# Patient Record
Sex: Female | Born: 1995 | Race: Black or African American | Hispanic: No | Marital: Single | Smoking: Former smoker
Health system: Southern US, Community
[De-identification: ages and names within clinical notes are randomized; demographics above are authoritative.]

## PROBLEM LIST (undated history)

## (undated) DIAGNOSIS — Z202 Contact with and (suspected) exposure to infections with a predominantly sexual mode of transmission: Secondary | ICD-10-CM

---
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
## 2017-04-15 ENCOUNTER — Ambulatory Visit (HOSPITAL_COMMUNITY)
Admission: EM | Admit: 2017-04-15 | Discharge: 2017-04-15 | Disposition: A | Discharge status: Home / Self Care | Payer: Medicaid Other | Attending: Family Medicine | Admitting: Family Medicine

## 2017-04-15 ENCOUNTER — Encounter (HOSPITAL_COMMUNITY): Payer: Self-pay | Admitting: Emergency Medicine

## 2017-04-15 DIAGNOSIS — T700XXA Otitic barotrauma, initial encounter: Secondary | ICD-10-CM | POA: Diagnosis not present

## 2017-04-15 DIAGNOSIS — H9193 Unspecified hearing loss, bilateral: Secondary | ICD-10-CM

## 2017-04-15 DIAGNOSIS — J309 Allergic rhinitis, unspecified: Secondary | ICD-10-CM

## 2017-04-15 DIAGNOSIS — H6983 Other specified disorders of Eustachian tube, bilateral: Secondary | ICD-10-CM

## 2017-04-15 MED ORDER — PREDNISONE 50 MG PO TABS: ORAL_TABLET | ORAL | 0 refills | Status: DC

## 2017-04-15 NOTE — ED Triage Notes (Signed)
Bilateral ear pain and fullness and altered hearing .  Patient has sinus congestion and stuffiness.  Patient has had episodes of gasping in her sleep.

## 2017-04-15 NOTE — Discharge Instructions (Signed)
Take Sudafed PE 10 mg every 4 hours. If this is not helping any  may want to try the pseudoephedrine that you have to sign for when purchased. It does have some side effects such as jitteriness, increased heart rate and blood pressure. Take the prednisone for inflammation. Take just 1 antihistamine such as Zyrtec or Allegra. Recommend not taking Benadryl as this is too drying. Use lots of saline nasal spray frequently 12 loosen the mucus and blow it out. You cannot overdose on this medicine. Also recommend using either Flonase or Rhinocort regularly. A Netty Pot can also useful and cleaning the sinuses.

## 2017-04-15 NOTE — ED Provider Notes (Signed)
CSN: [id number]     Arrival date & time 04/15/17  1716 History   First MD Initiated Contact with Patient 04/15/17 1852     Chief Complaint  Patient presents with  . Facial Pain   (Consider location/radiation/quality/duration/timing/severity/associated sxs/prior Treatment) 21 year old female complaining of diminished hearing in both ears over the past 3 weeks. She states since allergy season started several weeks ago she has had the worse case she is ever had. She has persistent upper respiratory congestion, nasal congestion as well. Denies otalgia. Does have some tinnitus. She has taken several OTC medications without significant relief. No fever or chills.      History reviewed. No pertinent past medical history. History reviewed. No pertinent surgical history. Family History  Problem Relation Age of Onset  . Diabetes Maternal Uncle   . Cancer Maternal Grandmother   . Hypertension Maternal Grandmother   . Heart disease Neg Hx    Social History  Substance Use Topics  . Smoking status: Never Smoker  . Smokeless tobacco: Not on file  . Alcohol use No   OB History    No data available     Review of Systems  Constitutional: Negative.  Negative for activity change, appetite change, chills, fatigue and fever.  HENT: Positive for congestion, postnasal drip and sinus pressure. Negative for ear discharge, ear pain, facial swelling, rhinorrhea, sore throat and trouble swallowing.   Eyes: Negative.   Respiratory: Negative for shortness of breath.        Sometimes at night she finds herself awaking gasping for air since she has had some much congestion.  Cardiovascular: Negative.   Gastrointestinal: Negative.   Musculoskeletal: Negative for neck pain and neck stiffness.  Skin: Negative for pallor and rash.  Neurological: Negative.   All other systems reviewed and are negative.   Allergies  Patient has no known allergies.  Home Medications   Prior to Admission medications    Medication Sig Start Date End Date Taking? Authorizing Provider  predniSONE (DELTASONE) 50 MG tablet 1 tab po daily for 6 days. Take with food. 04/15/17   Hayden RasmussenMabe, Keylani Perlstein, NP   Meds Ordered and Administered this Visit  Medications - No data to display  BP 99/61 (BP Location: Right Arm)   Pulse 77   Temp 97.9 F (36.6 C) (Oral)   Resp 16   LMP 04/01/2017   SpO2 100%  No data found.   Physical Exam  Constitutional: She is oriented to person, place, and time. She appears well-developed and well-nourished.  HENT:  Head: Normocephalic and atraumatic.  Mouth/Throat: No oropharyngeal exudate.  Bilateral TMs are severely retracted. Right TM with small dependent effusion. No erythema. No bleeding. EAC is widely patent.  Oropharynx with scant PND. No swelling. Minimal erythema.  Eyes: EOM are normal.  Neck: Normal range of motion. Neck supple.  Cardiovascular: Normal rate.   Pulmonary/Chest: Effort normal and breath sounds normal. No respiratory distress.  Musculoskeletal: Normal range of motion. She exhibits no edema.  Lymphadenopathy:    She has no cervical adenopathy.  Neurological: She is alert and oriented to person, place, and time.  Skin: Skin is warm and dry.  Nursing note and vitals reviewed.   Urgent Care Course     Procedures (including critical care time)  Labs Review Labs Reviewed - No data to display  Imaging Review No results found.   Visual Acuity Review  Right Eye Distance:   Left Eye Distance:   Bilateral Distance:    Right Eye Near:  Left Eye Near:    Bilateral Near:         MDM   1. Barotitis media, initial encounter   2. Decreased hearing of both ears   3. Allergic sinusitis   4. Eustachian tube dysfunction, bilateral    Take Sudafed PE 10 mg every 4 hours. If this is not helping any  may want to try the pseudoephedrine that you have to sign for when purchased. It does have some side effects such as jitteriness, increased heart rate and  blood pressure. Take the prednisone for inflammation. Take just 1 antihistamine such as Zyrtec or Allegra. Recommend not taking Benadryl as this is too drying. Use lots of saline nasal spray frequently 12 loosen the mucus and blow it out. You cannot overdose on this medicine. Also recommend using either Flonase or Rhinocort regularly. A Netty Pot can also useful and cleaning the sinuses. Meds ordered this encounter  Medications  . predniSONE (DELTASONE) 50 MG tablet    Sig: 1 tab po daily for 6 days. Take with food.    Dispense:  7 tablet    Refill:  0    Order Specific Question:   Supervising Provider    Answer:   Lonia Blood       Hayden Rasmussen, NP 04/15/17 1911

## 2017-04-23 ENCOUNTER — Ambulatory Visit: Payer: Medicaid Other | Admitting: Family Medicine

## 2017-06-12 ENCOUNTER — Ambulatory Visit (INDEPENDENT_AMBULATORY_CARE_PROVIDER_SITE_OTHER): Payer: Medicaid Other | Admitting: Family Medicine

## 2017-06-12 ENCOUNTER — Other Ambulatory Visit (HOSPITAL_COMMUNITY)
Admission: RE | Admit: 2017-06-12 | Discharge: 2017-06-12 | Disposition: A | Discharge status: Home / Self Care | Payer: Medicaid Other | Source: Ambulatory Visit | Attending: Family Medicine | Admitting: Family Medicine

## 2017-06-12 ENCOUNTER — Encounter: Payer: Self-pay | Admitting: Family Medicine

## 2017-06-12 VITALS — BP 102/70 | HR 68 | Temp 98.3°F | Ht 68.0 in | Wt 236.0 lb

## 2017-06-12 DIAGNOSIS — N939 Abnormal uterine and vaginal bleeding, unspecified: Secondary | ICD-10-CM | POA: Diagnosis not present

## 2017-06-12 DIAGNOSIS — Z202 Contact with and (suspected) exposure to infections with a predominantly sexual mode of transmission: Secondary | ICD-10-CM | POA: Diagnosis present

## 2017-06-12 LAB — POCT WET PREP (WET MOUNT)
Clue Cells Wet Prep Whiff POC: NEGATIVE
TRICHOMONAS WET PREP HPF POC: ABSENT

## 2017-06-12 NOTE — Progress Notes (Signed)
    Subjective:  Ann [name] is a 21 y.o. female presenting with monthly periods, hot flashes and cramping  HPI:  Patient is a 21 year old female with nexplanon who complains of monthly and sometimes bimonthly periods as well as hot flashes and cramping. She the nexplanon placed around 10 months ago and symptoms have persisted since then. Patient understands that it is not unusual to have bleeding for the first 6-12 months and the accompanying symptoms are common side effects with the exception of hot flashes. At this point she wishes to keep the nexplanon in place and to treat her symptoms. Reports taking as needed Tylenol for symptoms without much relief.    PMH: insignificant Tobacco use: brief history of smoking Medication: reviewed and updated ROS: see HPI   Objective:  Physical Exam: BP 102/70   Pulse 68   Temp 98.3 F (36.8 C) (Oral)   Ht 5\' 8"  (1.727 m)   Wt 236 lb (107 kg)   LMP 06/05/2017   SpO2 98%   BMI 35.88 kg/m   Gen: 21yo F in NAD, resting comfortably CV: RRR with no murmurs appreciated Pulm: NWOB, CTAB with no crackles, wheezes, or rhonchi GI: Normal bowel sounds present. Soft, Nontender, Nondistended. MSK: no edema, cyanosis, or clubbing noted GU: normal appearing external anatomy, cervix non-friable and no ectropion and no cervical motion tenderness or adnexa Skin: warm, dry Neuro: grossly normal, moves all extremities Psych: Normal affect and thought content  Results for orders placed or performed in visit on 06/12/17 (from the past 72 hour(s))  POCT Wet Prep Mellody Drown(Wet [place])     Status: Abnormal   Collection Time: 06/12/17  2:36 PM  Result Value Ref Range   Source Wet Prep POC VAG    WBC, Wet Prep HPF POC 5-10    Bacteria Wet Prep HPF POC Moderate (A) Few   Clue Cells Wet Prep HPF POC Few (A) None   Clue Cells Wet Prep Whiff POC Negative Whiff    Yeast Wet Prep HPF POC None    Trichomonas Wet Prep HPF POC Absent Absent     Assessment/Plan:    Abnormal uterine bleeding (AUB) Several months of heavy periods, hot flashes and cramping associated with menses.  Prior to having nexplanon placed she did not have these symptoms. Wishes to stick with nexplanon at this time. Placed 72mo ago. Options at this point would be trial of NSAIDs or combined oral contraceptive pills.   - 600mg  ibuprofen scheduled for 5-7 during next menses - f/u in 3 months - if symptoms persist will consider combined OCP or removal of nexplanon  STD screen Patient requested STD screening as she has never been tested and has previously had unprotected sex with two men over last year. No reason to believe she has STD. Neither man is an IVDU to her knowledge. -check GC/chlamydia, HIV, RPR - wet prep showing few clue cells, but patient is asymptomatic

## 2017-06-12 NOTE — Patient Instructions (Signed)
Ann [name], you were seen today for abnormal bleeding, cramps, hot flashes and abdominal discomfort with your periods.  This can be seen for the first 6-12 months after placing a nexplanon.  I would recommend that you take 600mg  of ibuprofen as needed for the next few months and I would like you to follow up with me at that point.   If your symptoms continue, we could consider trying a birth control pill for a couple of weeks, which sometimes helps with these symptoms.  Otherwise we could discuss removing the nexplanon if you are still uncomfortable.   We also checked today for STDs.  I will follow up with you regarding this results.   Very nice seeing you today.  Stoy Fenn L. Myrtie SomanWarden, MD [facility] Resident PGY-2 06/12/2017 3:02 PM

## 2017-06-12 NOTE — Assessment & Plan Note (Signed)
Several months of heavy periods, hot flashes and cramping associated with menses.  Prior to having nexplanon placed she did not have these symptoms. Wishes to stick with nexplanon at this time. Placed 48mo ago. Options at this point would be trial of NSAIDs or combined oral contraceptive pills.   - 600mg  ibuprofen scheduled for 5-7 during next menses - f/u in 3 months - if symptoms persist will consider combined OCP or removal of nexplanon

## 2017-06-13 LAB — RPR: RPR: NONREACTIVE

## 2017-06-13 LAB — CERVICOVAGINAL ANCILLARY ONLY
CHLAMYDIA, DNA PROBE: POSITIVE — AB
Neisseria Gonorrhea: NEGATIVE

## 2017-06-13 LAB — HIV ANTIBODY (ROUTINE TESTING W REFLEX): HIV SCREEN 4TH GENERATION: NONREACTIVE

## 2017-06-14 ENCOUNTER — Other Ambulatory Visit: Payer: Self-pay | Admitting: Family Medicine

## 2017-06-14 MED ORDER — AZITHROMYCIN 250 MG PO TABS: 1000.0000 mg | ORAL_TABLET | Freq: Once | ORAL | 0 refills | Status: AC

## 2017-06-14 NOTE — Progress Notes (Signed)
Called patient to let her know about pos chlamydia. Filled prescription for azithromycin 1g once and will write script for boyfriend and leave up front. Plan to call patient to let her know when script is available.   Arlee Bossard L. Myrtie SomanWarden, MD [facility] Resident PGY-2 06/14/2017 8:55 AM

## 2017-06-21 ENCOUNTER — Telehealth: Payer: Self-pay | Admitting: *Deleted

## 2017-06-21 NOTE — Telephone Encounter (Signed)
Patient states that she picked up her zithromax from the pharmacy and took all 4 at one time as prescribed but vomited about 20-30 mins after taking them.  She did take them on an empty stomach because that is what it said on the bottle.  She would like to know if she needs to get another script and take them with food this time or what her next step needs to be.  Did state that her boyfriend took his pills with no problem.  Will forward to MD. Burnard HawthorneJazmin [name],CMA

## 2017-06-24 ENCOUNTER — Other Ambulatory Visit: Payer: Self-pay | Admitting: Family Medicine

## 2017-06-24 MED ORDER — AZITHROMYCIN 250 MG PO TABS: 1000.0000 mg | ORAL_TABLET | Freq: Once | ORAL | 0 refills | Status: AC

## 2017-06-24 NOTE — Telephone Encounter (Signed)
Since she threw it up so soon after, I would recommend she take the medication again.  I have sent in another prescription.  Please let the patient know.  Thanks, Rande Bruntaniel L. Myrtie SomanWarden, MD [facility] Resident PGY-2 06/24/2017 7:46 AM

## 2017-06-24 NOTE — Telephone Encounter (Signed)
Pt contacted and informed of new rx sent to pharmacy. Pt stated she did not eat at all the day she took the medication. I informed pt to go ahead eat something and take medication later today. Pt voiced understanding.

## 2017-09-16 ENCOUNTER — Ambulatory Visit: Payer: Self-pay | Admitting: Family Medicine

## 2017-09-17 ENCOUNTER — Encounter: Payer: Self-pay | Admitting: Family Medicine

## 2017-09-17 ENCOUNTER — Other Ambulatory Visit: Payer: Self-pay | Admitting: Family Medicine

## 2017-09-17 ENCOUNTER — Other Ambulatory Visit (HOSPITAL_COMMUNITY)
Admission: RE | Admit: 2017-09-17 | Discharge: 2017-09-17 | Disposition: A | Discharge status: Home / Self Care | Payer: Medicaid Other | Source: Ambulatory Visit | Attending: Family Medicine | Admitting: Family Medicine

## 2017-09-17 ENCOUNTER — Ambulatory Visit (INDEPENDENT_AMBULATORY_CARE_PROVIDER_SITE_OTHER): Payer: Self-pay | Admitting: Family Medicine

## 2017-09-17 ENCOUNTER — Telehealth: Payer: Self-pay | Admitting: *Deleted

## 2017-09-17 VITALS — BP 104/68 | HR 68 | Temp 98.0°F | Ht 68.0 in | Wt 253.2 lb

## 2017-09-17 DIAGNOSIS — Z3046 Encounter for surveillance of implantable subdermal contraceptive: Secondary | ICD-10-CM

## 2017-09-17 DIAGNOSIS — Z113 Encounter for screening for infections with a predominantly sexual mode of transmission: Secondary | ICD-10-CM

## 2017-09-17 DIAGNOSIS — Z309 Encounter for contraceptive management, unspecified: Secondary | ICD-10-CM

## 2017-09-17 DIAGNOSIS — N6320 Unspecified lump in the left breast, unspecified quadrant: Secondary | ICD-10-CM

## 2017-09-17 DIAGNOSIS — N63 Unspecified lump in unspecified breast: Secondary | ICD-10-CM

## 2017-09-17 NOTE — Assessment & Plan Note (Signed)
Presented for nexplanon removal. Capsule difficult to palpate today. Had preceptor Dr. Gwendolyn GrantWalden attempt to palpate. Ordered ultrasound to locate device. If this is not deep or in muscle, will have her back for procedural clinic to ultrasound and remove. Otherwise may need referral.

## 2017-09-17 NOTE — Telephone Encounter (Signed)
Received call on nurse line that u/s needed to be scheduled with IR. Appt made for Th 11/1 with arrival at 1030 for 1045 appt. [address] on first floor. No special instructions. Attempted to relay appt info to patient. Left message on VM requesting return call. Kinnie FeilL. Sophiah Rolin, RN, BSN

## 2017-09-17 NOTE — Progress Notes (Signed)
    Subjective:  Ann [name] is a 21 y.o. female who [facility]on removal to have chlamydia recheck and for breast lump.   HPI:  Nexplanon: Has had this in for one year now. Previously was having vaginal bleeding during the first 6-12 months and some intermittent hot flashes and cramps. Will sometimes have some burning sensation in her abdomen, which she attributes to the nexplanon.  Non-smoker, no DVT history, no cancer history. Wishes to go on the pill.   Left breast mass: no fevers, chills or drainage. Similar spot 4 years ago and did have drainage from the nipple. Was hard and big yesterday and the size has decreased this morning. Not currently having her period, but does have some of the cramping symptoms.   History of chlamydia: Tested positive and treated in July. Denies any discharge, fevers, chills, dysuria, abdominal pain. Wants to be retested. No risky sexual behavior.    PMH: chlamydia infection, obesity Tobacco use: brief history of smoking Medication: reviewed and updated ROS: see HPI   Objective:  Physical Exam: BP 104/68   Pulse 68   Temp 98 F (36.7 C) (Oral)   Ht 5\' 8"  (1.727 m)   Wt 253 lb 3.2 oz (114.9 kg)   SpO2 99%   BMI 38.50 kg/m   Gen: 21yo F in NAD, resting comfortably CV: RRR with no murmurs appreciated Chest: Possible left breast mass near the nipple. However did have glandular texture. No surface skin changes.  Pulm: NWOB, CTAB with no crackles, wheezes, or rhonchi GI: Normal bowel sounds present. Soft, Nontender, Nondistended. MSK: no edema, cyanosis, or clubbing noted. Multiple nodular hyperpigmented areas that are minimally TTP in the suprapubic and inguinal regions bilaterally.  Skin: warm, dry, nexplanon capsule difficult to palpate GU: normal appearing female genitalia, speculum exam revealed mod amount of white discharge, red appearing cervix with white/clear dischage coming from a very mildly opened external  os. No cervical motion tenderness and no blood in vaginal vault or coming from cervix.  Neuro: grossly normal, moves all extremities Psych: Normal affect and thought content  No results found for this or any previous visit (from the past 72 hour(s)).   Assessment/Plan:  Encounter for contraceptive management Presented for nexplanon removal. Capsule difficult to palpate today. Had preceptor Dr. Gwendolyn GrantWalden attempt to palpate. Ordered ultrasound to locate device. If this is not deep or in muscle, will have her back for procedural clinic to ultrasound and remove. Otherwise may need referral.    Left breast mass Possible left breast mass. Felt more glandular. Patient is very concerned due to having a history of prior breast mass. I was unable to palpate a discrete nodule. Patient does have very large breasts. Ordered ultrasound of the left breast to rule out mass.   Hyperpigmented nodular lesions  In suprapubic and inguinal regions that are minimally TTP. No erythema or drainage. These have been there for a while now. Could be hydradenitis. Asked patient to schedule follow up appointment as we were discussing many things at this visit. There is no acute reason for treatment at this time.   Chlamydia, history of Endocervical collection and testing for gonorrhea and chlamydia today. No fevers, chills, abdominal pain, dysuria or CMT. Follow up gc/chlaymdia.   Pilar Corrales L. Myrtie SomanWarden, MD [facility] Resident PGY-2 09/17/2017 12:34 PM

## 2017-09-17 NOTE — Patient Instructions (Signed)
Naaman Plummermbriya, you were seen today for a check to make sure your chlamydia cleared up. I will follow up with you regarding those results.   I did not appreciate any discrete breast mass on your left side, but will get an ultrasound to make sure.   We were also unable to really get a great feel for your nexplanon in your arm.  I have ordered an ultrasound to locate it. When that comes back we will either have you back in the office to remove it or will send you to a specialist to get it removed if it is deep into the muscle.   If you have any concerns please call the office.    Very nice to see you today!  Take care, Darragh Nay L. Myrtie SomanWarden, MD [facility] Resident PGY-2 09/17/2017 11:05 AM

## 2017-09-18 ENCOUNTER — Encounter: Payer: Self-pay | Admitting: Family Medicine

## 2017-09-18 LAB — CERVICOVAGINAL ANCILLARY ONLY
Chlamydia: NEGATIVE
NEISSERIA GONORRHEA: NEGATIVE

## 2017-09-18 NOTE — Telephone Encounter (Signed)
Spoke with pt. She was aware of appt change. Scheduled appt for pt to meet with Dr. Myrtie SomanWarden. Sunday SpillersSharon T Saunders, CMA

## 2017-09-20 ENCOUNTER — Other Ambulatory Visit: Payer: Medicaid Other

## 2017-09-20 ENCOUNTER — Other Ambulatory Visit: Payer: Self-pay | Admitting: Family Medicine

## 2017-09-20 ENCOUNTER — Ambulatory Visit
Admission: RE | Admit: 2017-09-20 | Discharge: 2017-09-20 | Disposition: A | Discharge status: Home / Self Care | Payer: Medicaid Other | Source: Ambulatory Visit | Attending: Family Medicine | Admitting: Family Medicine

## 2017-09-20 DIAGNOSIS — N6320 Unspecified lump in the left breast, unspecified quadrant: Secondary | ICD-10-CM

## 2017-09-23 ENCOUNTER — Other Ambulatory Visit: Payer: Medicaid Other

## 2017-09-24 ENCOUNTER — Ambulatory Visit: Payer: Medicaid Other | Admitting: Family Medicine

## 2017-09-26 ENCOUNTER — Other Ambulatory Visit: Payer: Medicaid Other

## 2017-09-26 ENCOUNTER — Ambulatory Visit
Admission: RE | Admit: 2017-09-26 | Discharge: 2017-09-26 | Disposition: A | Discharge status: Home / Self Care | Payer: Medicaid Other | Source: Ambulatory Visit | Attending: Family Medicine | Admitting: Family Medicine

## 2017-09-26 DIAGNOSIS — Z3046 Encounter for surveillance of implantable subdermal contraceptive: Secondary | ICD-10-CM

## 2017-09-30 ENCOUNTER — Ambulatory Visit: Payer: No Typology Code available for payment source | Admitting: Family Medicine

## 2017-10-10 ENCOUNTER — Ambulatory Visit (INDEPENDENT_AMBULATORY_CARE_PROVIDER_SITE_OTHER): Payer: Medicaid Other | Admitting: Family Medicine

## 2017-10-10 ENCOUNTER — Encounter: Payer: Self-pay | Admitting: Family Medicine

## 2017-10-10 VITALS — BP 100/64 | HR 62 | Temp 98.7°F | Ht 68.0 in | Wt 252.2 lb

## 2017-10-10 DIAGNOSIS — Z30011 Encounter for initial prescription of contraceptive pills: Secondary | ICD-10-CM | POA: Diagnosis present

## 2017-10-10 DIAGNOSIS — Z309 Encounter for contraceptive management, unspecified: Secondary | ICD-10-CM

## 2017-10-10 DIAGNOSIS — Z3046 Encounter for surveillance of implantable subdermal contraceptive: Secondary | ICD-10-CM

## 2017-10-10 MED ORDER — NORGESTIMATE-ETH ESTRADIOL 0.25-35 MG-MCG PO TABS: 1.0000 | ORAL_TABLET | Freq: Every day | ORAL | 11 refills | Status: DC

## 2017-10-10 NOTE — Patient Instructions (Signed)
Naaman PlummerAmbriya, I have prescribed you spintec birth control pills.  You can start taking this on Sunday.  I would recommend back up with condoms for at least one week.   Your arm might be a bit sore when you go home tonight, so I would recommend tylenol and or inbuprofen as needed.   Nice to see you!  Take care, [name] Myrtie SomanWarden, MD [facility] Resident PGY-2 10/10/2017 3:23 PM

## 2017-10-15 ENCOUNTER — Encounter: Payer: Self-pay | Admitting: Family Medicine

## 2017-10-15 NOTE — Assessment & Plan Note (Signed)
Patient had Nexplanon removed today without complications.  Previously she had tolerated Sprintec and has requested to start this medication again.  Have recommended that she start this medication in 2 days at the beginning of the week and use condoms for backup for the next 1-2 weeks.  Discussed return precautions.  Follow-up as needed.

## 2017-10-15 NOTE — Progress Notes (Signed)
    Subjective:  Ann [name] is a 21 y.o. female who [facility] for Nexplanon removal and to discuss birth control options  HPI:  Contraception management: Patient presents today for Nexplanon removal.  She was seen by me recently and was sent to have ultrasound of her left upper extremity to locate an plantable device.  This was marked and patient was scheduled for follow-up appointment remove device.  However she had to reschedule and device is no longer marked.  She understands the risks and benefits of performing this procedure and signed consent.  She denies any abnormal uterine bleeding, abdominal pain, nausea, vomiting diarrhea or discharge.  She wishes to go back on Sprintec, which she tolerated prior to having Nexplanon inserted.   PMH: Obesity Tobacco use: None Medication: reviewed and updated ROS: see HPI   Objective:  Physical Exam: BP 100/64   Pulse 62   Temp 98.7 F (37.1 C) (Oral)   Ht 5\' 8"  (1.727 m)   Wt 252 lb 3.2 oz (114.4 kg)   SpO2 98%   BMI 38.35 kg/m   Gen: 21 year old female in NAD, resting comfortably CV: RRR with no murmurs appreciated Pulm: NWOB, CTAB with no crackles, wheezes, or rhonchi GI: Normal bowel sounds present. Soft, Nontender, Nondistended. MSK: no edema, cyanosis, or clubbing noted Skin: warm, dry, implantable contraceptive device palpable in left upper extremity Neuro: grossly normal, moves all extremities Psych: Normal affect and thought content  No results found for this or any previous visit (from the past 72 hour(s)).  Anesthesia: 2% Lidocaine w/ epinephrine 5 ml  Procedure: Nexplanon removal. Consent obtained. A time-out was performed prior to initiating procedure to be sure of right patient and right location. The area surrounding the Nexplanon was prepared with Betadine and draped in the usual sterile manner. The site was anesthetized with lidocaine. A skin incision was made over the distal aspect of the device.   Device was visualized through incision and secured with hemostat and removed without comp location.  No significant blood loss.  The site was cleaned and dressed and secured with SteriStrips and a pressure dressing. The patient tolerated the procedure well.   Assessment/Plan:  Encounter for contraceptive management Patient had Nexplanon removed today without complications.  Previously she had tolerated Sprintec and has requested to start this medication again.  Have recommended that she start this medication in 2 days at the beginning of the week and use condoms for backup for the next 1-2 weeks.  Discussed return precautions.  Follow-up as needed.   Health maintenance Patient declined flu shot today.  Have encouraged her to follow-up in the next couple of months for shot if she changes her mind.  Shermon Bozzi L. Myrtie SomanWarden, MD [facility] Resident PGY-2 10/15/2017 10:05 AM

## 2017-11-13 ENCOUNTER — Encounter: Payer: Self-pay | Admitting: Student

## 2017-11-13 ENCOUNTER — Other Ambulatory Visit: Payer: Self-pay

## 2017-11-13 ENCOUNTER — Ambulatory Visit (INDEPENDENT_AMBULATORY_CARE_PROVIDER_SITE_OTHER): Payer: Self-pay | Admitting: Student

## 2017-11-13 VITALS — BP 98/60 | HR 69 | Temp 97.9°F | Ht 68.0 in | Wt 244.6 lb

## 2017-11-13 DIAGNOSIS — Z3200 Encounter for pregnancy test, result unknown: Secondary | ICD-10-CM

## 2017-11-13 DIAGNOSIS — Z23 Encounter for immunization: Secondary | ICD-10-CM

## 2017-11-13 DIAGNOSIS — W19XXXA Unspecified fall, initial encounter: Secondary | ICD-10-CM

## 2017-11-13 DIAGNOSIS — Z6837 Body mass index (BMI) 37.0-37.9, adult: Secondary | ICD-10-CM

## 2017-11-13 DIAGNOSIS — E6609 Other obesity due to excess calories: Secondary | ICD-10-CM

## 2017-11-13 LAB — POCT URINE PREGNANCY: Preg Test, Ur: NEGATIVE

## 2017-11-13 NOTE — Patient Instructions (Addendum)
It was great seeing you today! We have addressed the following issues today  Pregnancy test: Your pregnancy test is negative today.  Weight issue: I strongly recommend lifestyle change including diet and exercise.  Please see below for some tips on this.  Please call the number we gave you to schedule a follow-up appointment with our nutritionist.   Fall: It is unclear what has led to you fall.  I suspect this is due to dehydration.  I also recommend eating regular meals, breakfast lunch and dinner.   If we did any lab work today, and the results require attention, either me or my nurse will get in touch with you. If everything is normal, you will get a letter in mail and a message via . If you don't hear from us in two weeks, please give us a call. Otherwise, we look forward to seeing you again at your next visit. If you have any questions or concerns before then, please call the clinic at [phone].  Please bring all your medications to every doctors visit  Sign up for My Chart to have easy access to your labs results, and communication with your Primary care physician.    Please check-out at the front desk before leaving the clinic.    Take Care,   Dr. Alanda SlimGonfa  Portion Size   Choose healthier foods such as 100% whole grains, vegetables, fruits, beans, nut seeds, olive oil, most vegetable oils, fat-free dietary, wild game and fish.   Avoid sweet tea, other sweetened beverages, soda, fruit juice, cold cereal and milk and trans fat.   Eat at least 3 meals and 1-2 snacks per day.  Aim for no more than 5 hours between eating.  Eat breakfast within one hour of getting up.    Exercise at least 150 minutes per week, including weight resistance exercises 3 or 4 times per week.   Try to lose at least 7-10% of your current body weight.   Limit your salt (Sodium) intake to less than 2 gm (2000 mg) a day if you have conditions such as  elevated blood pressure, heart failure...     You may also read about DASH and/or Mediterranean diet at the following web site if you have blood pressure or heart condition. [employer]com.cywww.mayoclinic.org/healthy-lifestyle/nutrition-and-healthy-eating/in-depth/dash-diet http://m[place]inney.org/www.mayoclinic.org/healthy-lifestyle/nutrition-and-healthy-eating/in-depth/mediterranean-diet/

## 2017-11-13 NOTE — Progress Notes (Signed)
Subjective:    Ann [name] is a 21 y.o. old female here for pregnancy test, fall and weight   HPI Pregnancy test: sexually active with female partner. LMP 10/12/2017. Period regular. Came off birth control (Nexplanon) two months ago. Last sexual intercourse was 4 days ago. Using condoms consistently. Not planning to get pregnant.  Fall: fell about two days ago at work. She is a Programmer, applicationshouse keeper. She got up from sitting, felt hot and dizzy (vertigo) and fell with left side of her face on marble floor. She says her mother in law was present. LOC for about 10 sec. She felt thirsty when she woke up. She felt better after drinking water.  Did not seek care until today. She had only fiber bar andtwo bottles of water that morning.  Denies chest pain or shortness of breath.  Did not have further dizzy spells.  Never had a fall before. She denies smoking cigarettes or drinking alcohol.  She reports smoking marijuana every 2 days.  She reports taking some vitamin supplements over-the-counter. Denies headache, vision change, arm or leg weakness or numbness  Weight: She is interested in losing weight.  She has been trying to walk and eat right.  She says she lost about 8 pounds over the last 1 months.  She reports eating green vegetables regularly.  She denies soda, juice or an alcohol.  She is interested in referral to our nutritionist for help with weight management.  PMH/Problem List: has OBESITY, UNSPECIFIED; RHINITIS, ALLERGIC, DUE TO POLLEN; ECZEMA, ATOPIC DERMATITIS; Psoriasis of scalp; Left breast mass; Encounter for contraceptive management; Healthcare maintenance; and Abnormal uterine bleeding (AUB) on their problem list.   has no past medical history on file.  FH:  Family History  Problem Relation Age of Onset  . Diabetes Maternal Uncle   . Cancer Maternal Grandmother   . Hypertension Maternal Grandmother   . Heart disease Neg Hx     SH Social History   Tobacco Use  . Smoking status: Never  Smoker  . Smokeless tobacco: Never Used  Substance Use Topics  . Alcohol use: No  . Drug use: No    Review of Systems Review of systems negative except for pertinent positives and negatives in history of present illness above.     Objective:     Vitals:   11/13/17 0942  BP: 98/60  Pulse: 69  Temp: 97.9 F (36.6 C)  TempSrc: Oral  SpO2: 99%  Weight: 244 lb 9.6 oz (110.9 kg)  Height: 5\' 8"  (1.727 m)   Body mass index is 37.19 kg/m.  Physical Exam  GEN: appears obese, no apparent distress. Head: normocephalic and atraumatic except for some scraped skin over her left upper eyelid and left cheek Eyes: conjunctiva without injection, sclera anicteric, PERRLA, EOMI, no proptosis HEM: negative for cervical or periauricular lymphadenopathies CVS: RRR, nl s1 & s2, no murmurs, no edema RESP: no IWOB, good air movement bilaterally, CTAB GI: BS present & normal, soft, NTND MSK: no focal tenderness or notable swelling SKIN: Some prescription skin over his left upper eyelid and left cheek ENDO: negative thyromegally NEURO: alert and oiented appropriately, no gross deficits  PSYCH: euthymic mood with congruent affect    Assessment and Plan:  1. Possible pregnancy: reports using condom consistently. Pregnancy test negative.   2.  Class 2 obesity due to excess calories without serious comorbidity with body mass index (BMI) of 37.0 to 37.9 in adult She has lost some weight recently. Discussed about exercise and diet.  Gave her handouts. She is interested in referral to our nutritionist for weight management. Gave her phone number to call and schedule. Also recommended follow up with PCP. - CBC With Differential - Comprehensive metabolic panel - Hemoglobin A1c - Lipid panel - TSH - VITAMIN D 25 Hydroxy (Vit-D Deficiency, Fractures)  3. Fall, initial encounter: likely due to orthostasis from dehydration based on her description of event. Could also be hypoglycemia as she didn't eat  that morning. Low suspicion for cardiac syncope in young age. Encouraged her adequate hydration and not skipping meals. Discussed return precautions  4. Need for immunization against influenza - Flu Vaccine QUAD 36+ mos IM   Return in about 2 months (around 01/14/2018) for Follow up with PCP for weight mgt.  Almon Herculesaye T Bev Drennen, MD 11/13/17 Pager: [phone]

## 2017-11-14 LAB — CBC WITH DIFFERENTIAL
BASOS: 0 %
Basophils Absolute: 0 10*3/uL (ref 0.0–0.2)
EOS (ABSOLUTE): 0.5 10*3/uL — AB (ref 0.0–0.4)
Eos: 7 %
HEMOGLOBIN: 13.2 g/dL (ref 11.1–15.9)
Hematocrit: 38.9 % (ref 34.0–46.6)
Immature Grans (Abs): 0 10*3/uL (ref 0.0–0.1)
Immature Granulocytes: 0 %
Lymphocytes Absolute: 2.9 10*3/uL (ref 0.7–3.1)
Lymphs: 38 %
MCH: 27.8 pg (ref 26.6–33.0)
MCHC: 33.9 g/dL (ref 31.5–35.7)
MCV: 82 fL (ref 79–97)
MONOCYTES: 7 %
Monocytes Absolute: 0.5 10*3/uL (ref 0.1–0.9)
NEUTROS PCT: 48 %
Neutrophils Absolute: 3.7 10*3/uL (ref 1.4–7.0)
RBC: 4.75 x10E6/uL (ref 3.77–5.28)
RDW: 14 % (ref 12.3–15.4)
WBC: 7.7 10*3/uL (ref 3.4–10.8)

## 2017-11-14 LAB — COMPREHENSIVE METABOLIC PANEL
A/G RATIO: 1.4 (ref 1.2–2.2)
ALT: 22 IU/L (ref 0–32)
AST: 16 IU/L (ref 0–40)
Albumin: 4.3 g/dL (ref 3.5–5.5)
Alkaline Phosphatase: 80 IU/L (ref 39–117)
BUN/Creatinine Ratio: 8 — ABNORMAL LOW (ref 9–23)
BUN: 6 mg/dL (ref 6–20)
Bilirubin Total: 0.4 mg/dL (ref 0.0–1.2)
CO2: 24 mmol/L (ref 20–29)
Calcium: 9.4 mg/dL (ref 8.7–10.2)
Chloride: 103 mmol/L (ref 96–106)
Creatinine, Ser: 0.73 mg/dL (ref 0.57–1.00)
GFR, EST AFRICAN AMERICAN: 138 mL/min/{1.73_m2} (ref >59)
GFR, EST NON AFRICAN AMERICAN: 120 mL/min/{1.73_m2} (ref >59)
GLUCOSE: 109 mg/dL — AB (ref 65–99)
Globulin, Total: 3.1 g/dL (ref 1.5–4.5)
Potassium: 4.2 mmol/L (ref 3.5–5.2)
Sodium: 140 mmol/L (ref 134–144)
TOTAL PROTEIN: 7.4 g/dL (ref 6.0–8.5)

## 2017-11-14 LAB — HEMOGLOBIN A1C
Est. average glucose Bld gHb Est-mCnc: 131 mg/dL
Hgb A1c MFr Bld: 6.2 % — ABNORMAL HIGH (ref 4.8–5.6)

## 2017-11-14 LAB — TSH: TSH: 0.885 u[IU]/mL (ref 0.450–4.500)

## 2017-11-14 LAB — LIPID PANEL
CHOL/HDL RATIO: 3.9 ratio (ref 0.0–4.4)
CHOLESTEROL TOTAL: 124 mg/dL (ref 100–169)
HDL: 32 mg/dL — ABNORMAL LOW (ref >39)
LDL Calculated: 75 mg/dL (ref 0–109)
TRIGLYCERIDES: 85 mg/dL (ref 0–89)
VLDL CHOLESTEROL CAL: 17 mg/dL (ref 5–40)

## 2017-11-14 LAB — VITAMIN D 25 HYDROXY (VIT D DEFICIENCY, FRACTURES): VIT D 25 HYDROXY: 11.7 ng/mL — AB (ref 30.0–100.0)

## 2017-12-20 ENCOUNTER — Encounter: Payer: Self-pay | Admitting: Student

## 2017-12-20 DIAGNOSIS — R55 Syncope and collapse: Secondary | ICD-10-CM | POA: Insufficient documentation

## 2018-01-06 ENCOUNTER — Ambulatory Visit: Payer: Medicaid Other | Admitting: Family Medicine

## 2018-01-22 ENCOUNTER — Ambulatory Visit: Payer: Medicaid Other | Admitting: Internal Medicine

## 2018-01-22 ENCOUNTER — Ambulatory Visit: Payer: Medicaid Other | Admitting: Family Medicine

## 2018-03-28 ENCOUNTER — Encounter: Payer: Self-pay | Admitting: Student

## 2018-03-28 ENCOUNTER — Other Ambulatory Visit: Payer: Self-pay

## 2018-03-28 ENCOUNTER — Other Ambulatory Visit (HOSPITAL_COMMUNITY)
Admission: RE | Admit: 2018-03-28 | Discharge: 2018-03-28 | Disposition: A | Discharge status: Home / Self Care | Payer: Medicaid Other | Source: Ambulatory Visit | Attending: Family Medicine | Admitting: Family Medicine

## 2018-03-28 ENCOUNTER — Ambulatory Visit (INDEPENDENT_AMBULATORY_CARE_PROVIDER_SITE_OTHER): Payer: Self-pay | Admitting: Student

## 2018-03-28 VITALS — BP 104/62 | HR 68 | Temp 98.8°F | Ht 68.0 in | Wt 245.8 lb

## 2018-03-28 DIAGNOSIS — Z3202 Encounter for pregnancy test, result negative: Secondary | ICD-10-CM

## 2018-03-28 DIAGNOSIS — Z113 Encounter for screening for infections with a predominantly sexual mode of transmission: Secondary | ICD-10-CM

## 2018-03-28 DIAGNOSIS — N63 Unspecified lump in unspecified breast: Secondary | ICD-10-CM

## 2018-03-28 LAB — POCT WET PREP (WET MOUNT)
CLUE CELLS WET PREP WHIFF POC: NEGATIVE
TRICHOMONAS WET PREP HPF POC: ABSENT

## 2018-03-28 LAB — POCT URINE PREGNANCY: PREG TEST UR: NEGATIVE

## 2018-03-28 NOTE — Patient Instructions (Addendum)
It was great seeing you today! 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  See below for more on this..  However, we will get an ultrasound of your breast when you have you financial assistance set up.  Please schedule an appointment with our financial counselor as soon as possible.  Then follow-up in our [facility]/women's clinic in 6 weeks.  Please come back and see [place] if you have worsening of your symptoms or other symptoms concerning to you.  Vaginal discharge: Your test did not show bacterial vaginosis or yeast infection.  Your other tests may take 2 to 3 days to come back.  We will let you if there is anything concerning.   Your pregnancy test is negative.  As we discussed this does not exclude recent pregnancy within the last 10 days.   If we did any lab work today, and the results require attention, either me or my nurse will get in touch with you. If everything is normal, you will get a letter in mail and a message via . If you don't hear from [place] in two weeks, please give [place] a call. Otherwise, we look forward to seeing you again at your next visit. If you have any questions or concerns before then, please call the clinic at [phone].  Please bring all your medications to every doctors visit  Sign up for My Chart to have easy access to your labs results, and communication with your Primary care physician.    Please check-out at the front desk before leaving the clinic.    Take Care,   Dr. Alanda Slim   Fibroadenoma A fibroadenoma is a lump (tumor) in the breast. The lump is not cancer (is benign). It may move under your skin when you touch it. This kind of lump can grow in one breast or in both breasts. Follow these instructions at home:  If you had a lump removed, follow instructions from your doctor for home care after the procedure.  Check your breasts at home as told by your doctor.  Keep all  follow-up visits as told by your doctor. This is important. Contact a doctor if:  The lump changes in size or feels different.  The lump starts to be painful.  You find a new lump.  You have any changes in the skin that covers your breast.  You have any changes in your nipple.  You have fluid leaking from your nipple. This information is not intended to replace advice given to you by your health care provider. Make sure you discuss any questions you have with your health care provider. Document Released: 02/08/2009 Document Revised: 04/19/2016 Document Reviewed: 11/03/2014 Elsevier Interactive Patient Education  [employer].

## 2018-03-28 NOTE — Progress Notes (Signed)
.  GEN

## 2018-03-28 NOTE — Progress Notes (Signed)
Subjective:    Ann [name] is a 22 y.o. old female here for left breast cyst and STD check  HPI "Left breast cyst": She reports intermittent breast yeast almost every 2 years for the last 4 years.  This time, she has breast cyst for the last 6 months.  She was referred to breast center a 6 months ago.  She had an ultrasound that showed an oval hypoechoic collection in the 2:00 location of the left breast about 3 cm from the nipple which measures 1.5 x 0.7 x 1.4 cm, which was thought to be either an early abscess or complicated cyst.  At that time she was tried on doxycycline 100 mg for 7 days without significant improvement.  She was recommended an ultrasound-guided aspiration.  Unfortunately, she did not have insurance and she did not have these done. She thinks the cyst is getting bigger.  It is painful and tender.  She also reports left nipple inversion.  She denies discharge or overlying skin color change.  She denies association of pain and size change with her cycle.  Denies fever or chills.  No history of breast cancer in her mother or sisters.  Grandmother with breast cancer.  STD check: Likes to have STD check.  She reports one female sexual partner in the last 6 months but admits having oral sex with another female about 6 weeks ago.  She denies sore throat, hoarseness or oral lesion.  She denies vaginal discharge, bleeding or dysuria.  She admits chronic low abdominal pain and back pain for over a year.  LMP 03/06/2018.  She is not on birth control but uses condoms occasionally.  Last sexual intercourse about 2 weeks ago.  Denies smoking cigarettes or recreational drug use.  PMH/Problem List: has Obesity (BMI 35.0-39.9 without comorbidity); RHINITIS, ALLERGIC, DUE TO POLLEN; ECZEMA, ATOPIC DERMATITIS; Psoriasis of scalp; Breast mass in female; Encounter for contraceptive management; Screen for STD (sexually transmitted disease); Abnormal uterine bleeding (AUB); and Syncopal episodes on their  problem list.   has no past medical history on file.  FH:  Family History  Problem Relation Age of Onset  . Diabetes Maternal Uncle   . Cancer Maternal Grandmother   . Hypertension Maternal Grandmother   . Heart disease Neg Hx     SH Social History   Tobacco Use  . Smoking status: Never Smoker  . Smokeless tobacco: Never Used  Substance Use Topics  . Alcohol use: No  . Drug use: No    Review of Systems Review of systems negative except for pertinent positives and negatives in history of present illness above.     Objective:     Vitals:   03/28/18 1623  BP: 104/62  Pulse: 68  Temp: 98.8 F (37.1 C)  TempSrc: Oral  SpO2: 96%  Weight: 245 lb 12.8 oz (111.5 kg)  Height:  (1.727 m)   Body mass index is 37.37 kg/m.  Physical Exam  GEN: appears well & comfortable. No apparent distress. HEM: No axillary lymphadenopathy. CVS: RRR, nl s1 & s2, no murmurs RESP: no IWOB GI: BS present & normal, soft, NTND,  GU:   Pelvic Exam Mild suprapubic.  No rebound no guarding.  No CVA tenderness External genitalia: normal without surrounding skin lesion, obvious discharge or bleeeding.  Speculum: pink vaginal mucosa, ruggated, normal cervix & discharge. Bimanual: no cervical motion tenderness or adnexal mass. Uterus appears normal size  Breast exam: Both breasts appear symmetric except for nipple inversion in left breast.  No notable nipple discharge.  No overlying skin color change.  No increased warmth to touch.  Tenderness to palpation about 2 to 3 cm lateral to her left nipple at 2-3 o'clock position. Palpable mobile, smooth and round nodule/mass about a quarter size at the same location. No palpable lymph node in her axilla bilaterally. Right breast normal.  MSK: no focal tenderness or notable swelling SKIN: no apparent skin lesion NEURO: alert and oiented appropriately, no gross deficits   PSYCH: euthymic mood with congruent affect   Chaperone (Ms. April) present  for pelvic and breast exam.    Assessment and Plan:  1. Breast mass in female: unclear etiology but differential diagnosis include fibroadenoma, breast cyst, an abscess or fibrocystic change. Location and history favors fibroadenoma or breast cyst.  Breast abscess should have significantly gotten better or worse by now.  Pain and tenderness have no correlation with her cycle to suggest fibrocystic change.  Patient had an ultrasound that showed an oval hypoechoic collection in the 2:00 location of the left breast about 3 cm from the nipple which measures 1.5 x 0.7 x 1.4 cm, which was thought to be either an early abscess or complicated cyst.  She was treated with doxycycline for 1 week without improvement. I doubt malignancy given her age.  She has no significant family history either.  Repeat ultrasound would have been reasonable but patient has no insurance.  So recommended applying for financial assistance at our clinic.  Given information package.  Follow-up in our [facility] clinic in 6 weeks with Dr. Jennette Kettle  2. Screen for STD (sexually transmitted disease): speculum and pelvic exam within normal limits.  Wet prep within normal limits. - Cervicovaginal ancillary only - Patient declined HIV test.  3. Urine pregnancy test negative -Discussed with the patient that these may not exclude recent pregnancy within the last 10 days.   Return in about 6 weeks (around 05/09/2018) for [facility] clinic with Dr. Jennette Kettle for left breast mass.  Almon Hercules, MD 03/28/18 Pager: [phone]  Precepted patient with Dr. Jennette Kettle, who have examined the patient and helped formulating the plan.

## 2018-03-31 LAB — CERVICOVAGINAL ANCILLARY ONLY
Chlamydia: NEGATIVE
Neisseria Gonorrhea: NEGATIVE
Trichomonas: NEGATIVE

## 2018-04-14 NOTE — Progress Notes (Deleted)
   Subjective:   Patient ID: Ann [name]    DOB: December 04, 1995, 22 y.o. female   MRN: [id number]  Ann [name] is a 22 y.o. female with a history of eczema, allergies, obesity here for   Gallbladder and pancreas check? - ***  Prediabetes - Hb A1c 10/2017 - 6.2 - Diet: *** - Exercise: ***  Review of Systems:  Per HPI.   PMFSH: reviewed. Smoking status reviewed. Medications reviewed.  Objective:   There were no vitals taken for this visit. Vitals and nursing note reviewed.  General: well nourished, well developed, in no acute distress with non-toxic appearance HEENT: normocephalic, atraumatic, moist mucous membranes Neck: supple, non-tender without lymphadenopathy CV: regular rate and rhythm without murmurs, rubs, or gallops, no lower extremity edema Lungs: clear to auscultation bilaterally with normal work of breathing Abdomen: soft, non-tender, non-distended, no masses or organomegaly palpable, normoactive bowel sounds Skin: warm, dry, no rashes or lesions Extremities: warm and well perfused, normal tone MSK: ROM grossly intact, strength intact, gait normal Neuro: Alert and oriented, speech normal  Assessment & Plan:   No problem-specific Assessment & Plan notes found for this encounter.  No orders of the defined types were placed in this encounter.  No orders of the defined types were placed in this encounter.   Ellwood Dense, DO PGY-1, [facility] 04/14/2018 9:19 PM

## 2018-04-16 ENCOUNTER — Ambulatory Visit: Payer: Self-pay | Admitting: Family Medicine

## 2018-07-16 ENCOUNTER — Ambulatory Visit (INDEPENDENT_AMBULATORY_CARE_PROVIDER_SITE_OTHER): Payer: Self-pay | Admitting: Family Medicine

## 2018-07-16 ENCOUNTER — Other Ambulatory Visit (HOSPITAL_COMMUNITY)
Admission: RE | Admit: 2018-07-16 | Discharge: 2018-07-16 | Disposition: A | Discharge status: Home / Self Care | Payer: Medicaid Other | Source: Ambulatory Visit | Attending: Family Medicine | Admitting: Family Medicine

## 2018-07-16 ENCOUNTER — Other Ambulatory Visit: Payer: Self-pay

## 2018-07-16 ENCOUNTER — Ambulatory Visit: Payer: Medicaid Other

## 2018-07-16 VITALS — BP 102/60 | HR 81 | Temp 98.7°F | Ht 68.0 in | Wt 245.6 lb

## 2018-07-16 DIAGNOSIS — J029 Acute pharyngitis, unspecified: Secondary | ICD-10-CM

## 2018-07-16 DIAGNOSIS — Z3009 Encounter for other general counseling and advice on contraception: Secondary | ICD-10-CM

## 2018-07-16 DIAGNOSIS — J020 Streptococcal pharyngitis: Secondary | ICD-10-CM | POA: Insufficient documentation

## 2018-07-16 MED ORDER — FLUTICASONE PROPIONATE 50 MCG/ACT NA SUSP: 2.0000 | Freq: Every day | NASAL | 6 refills | Status: DC

## 2018-07-16 MED ORDER — PREDNISONE 50 MG PO TABS: 50.0000 mg | ORAL_TABLET | Freq: Every day | ORAL | 0 refills | Status: DC

## 2018-07-16 MED ORDER — CETIRIZINE HCL 10 MG PO TABS: 10.0000 mg | ORAL_TABLET | Freq: Every day | ORAL | 11 refills | Status: DC

## 2018-07-16 NOTE — Progress Notes (Signed)
   Subjective:    Ann [name] - 22 y.o. female MRN [id number]  Date of birth: April 27, 1996  HPI  Ann [name] is here for right sided congestion and sore throat. Started two weeks ago after having oral sex  Had very little sleep over the weekend, and this made her symptoms worse - she felt like she had the flu at that time So had some abdominal pain that has since improved She now has a painful R ear and ringing in both ears She took Advil and apple cider vinegar tea for her throat, which was somewhat helpful Also used olive oil in her ears to soften the wax and a bobby pin to dig out wax  In May 2018, she had similar symptoms and was prescribed prednisone, which worked well for her   Health Maintenance:  Health Maintenance Due  Topic Date Due  . INFLUENZA VACCINE  06/26/2018    -  reports that she has never smoked. She has never used smokeless tobacco. - Review of Systems: Per HPI. - Past Medical History: Patient Active Problem List   Diagnosis Date Noted  . Acute pharyngitis 07/16/2018  . Streptococcal sore throat 07/16/2018  . Syncopal episodes 12/20/2017  . Abnormal uterine bleeding (AUB) 06/12/2017  . Encounter for contraceptive management 08/09/2016  . Screen for STD (sexually transmitted disease) 08/09/2016  . Breast mass in female 08/12/2013  . Obesity (BMI 35.0-39.9 without comorbidity) 05/06/2009  . RHINITIS, ALLERGIC, DUE TO POLLEN 03/17/2007  . 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  . ECZEMA, ATOPIC DERMATITIS 01/23/2007   - Medications: reviewed and updated   Objective:   Physical Exam BP 102/60   Pulse 81   Temp 98.7 F (37.1 C) (Oral)   Ht 5\' 8"  (1.727 m)   Wt 245 lb 9.6 oz (111.4 kg)   LMP 07/01/2018   SpO2 99%   BMI 37.34 kg/m  Gen: NAD, alert, cooperative with exam, well-appearing, sounds congested HEENT: NCAT, PERRL, clear conjunctiva, oropharynx slightly erythematous but without exudate, supple neck, bilateral tympanic membranes clear without  inflammation, no rhinorrhea, no cervical lymphadenopathy CV: RRR, good S1/S2, no murmur Resp: CTABL, no wheezes, non-labored   Assessment & Plan:   Acute pharyngitis Likely due to a viral URI given congestion.  Obtained gonococcal swab today given her recent history of unprotected oral sex.  Prescribed Flonase and Zyrtec for symptom control.  Also prescribed 1 dose of prednisone 50 mg but advised patient to not fill this prescription until we know that her swab results are negative.  Advised patient to drink hot tea with honey for symptomatic relief of her sore throat.  Encounter for contraceptive management Currently on birth control due to side effects of Nexplanon and the pill.  Discussed importance of condom use every time she has sex, and let her know that she can make an appointment any time to be seen if she would like to discuss other contraceptive options.    Lezlie OctaveAmanda Winfrey, M.D. 07/16/2018, 4:18 PM PGY-2, [facility]

## 2018-07-16 NOTE — Assessment & Plan Note (Signed)
Currently on birth control due to side effects of Nexplanon and the pill.  Discussed importance of condom use every time she has sex, and let her know that she can make an appointment any time to be seen if she would like to discuss other contraceptive options.

## 2018-07-16 NOTE — Patient Instructions (Addendum)
It was nice meeting you today Ms. Ann [name]!  I will call you with the results of your test in the next day or two.  Please wait to take the Prednisone until we know that the test is negative.  You can go ahead and start the nose spray (Flonase) and Zyrtec.  Tylenol is also helpful for a sore throat, as well as hot tea with honey.  Please come back in two weeks if you symptoms have not improved.  If you have any questions or concerns, please feel free to call the clinic.   Be well,  Dr. Frances FurbishWinfrey

## 2018-07-16 NOTE — Assessment & Plan Note (Signed)
Likely due to a viral URI given congestion.  Obtained gonococcal swab today given her recent history of unprotected oral sex.  Prescribed Flonase and Zyrtec for symptom control.  Also prescribed 1 dose of prednisone 50 mg but advised patient to not fill this prescription until we know that her swab results are negative.  Advised patient to drink hot tea with honey for symptomatic relief of her sore throat.

## 2018-07-17 LAB — CERVICOVAGINAL ANCILLARY ONLY
CHLAMYDIA, DNA PROBE: NEGATIVE
Neisseria Gonorrhea: NEGATIVE

## 2018-07-18 ENCOUNTER — Telehealth: Payer: Self-pay | Admitting: Family Medicine

## 2018-07-18 NOTE — Telephone Encounter (Signed)
Called patient to let her know her throat culture results were negative and that she could pick up her Prednisone if she wanted.

## 2018-08-08 ENCOUNTER — Ambulatory Visit: Payer: Medicaid Other | Admitting: Family Medicine

## 2018-08-12 ENCOUNTER — Emergency Department (HOSPITAL_COMMUNITY): Payer: No Typology Code available for payment source

## 2018-08-12 ENCOUNTER — Encounter (HOSPITAL_COMMUNITY): Payer: Self-pay

## 2018-08-12 ENCOUNTER — Other Ambulatory Visit: Payer: Self-pay

## 2018-08-12 ENCOUNTER — Emergency Department (HOSPITAL_COMMUNITY)
Admission: EM | Admit: 2018-08-12 | Discharge: 2018-08-12 | Disposition: A | Discharge status: Home / Self Care | Payer: No Typology Code available for payment source | Attending: Emergency Medicine | Admitting: Emergency Medicine

## 2018-08-12 DIAGNOSIS — M545 Low back pain, unspecified: Secondary | ICD-10-CM

## 2018-08-12 DIAGNOSIS — Y92414 Local residential or business street as the place of occurrence of the external cause: Secondary | ICD-10-CM | POA: Diagnosis not present

## 2018-08-12 DIAGNOSIS — Y9389 Activity, other specified: Secondary | ICD-10-CM | POA: Diagnosis not present

## 2018-08-12 DIAGNOSIS — Y999 Unspecified external cause status: Secondary | ICD-10-CM | POA: Insufficient documentation

## 2018-08-12 LAB — I-STAT BETA HCG BLOOD, ED (MC, WL, AP ONLY): I-stat hCG, quantitative: <5 m[IU]/mL (ref <5)

## 2018-08-12 MED ORDER — OXYCODONE HCL 5 MG PO TABS
5.0000 mg | ORAL_TABLET | Freq: Once | ORAL | Status: AC
  Administered 2018-08-12: 5 mg via ORAL
  Filled 2018-08-12: qty 1

## 2018-08-12 MED ORDER — NAPROXEN 500 MG PO TABS: 500.0000 mg | ORAL_TABLET | Freq: Two times a day (BID) | ORAL | 0 refills | Status: DC

## 2018-08-12 MED ORDER — CYCLOBENZAPRINE HCL 10 MG PO TABS: 10.0000 mg | ORAL_TABLET | Freq: Two times a day (BID) | ORAL | 0 refills | Status: DC | PRN
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
## 2018-08-12 NOTE — Discharge Instructions (Addendum)
You were evaluated in the Emergency Department and after careful evaluation, we did not find any emergent condition requiring admission or further testing in the hospital.  Your symptoms today seem to be due to muscle strain or spasm.  Please use the medications provided as directed.  Please return to the Emergency Department if you experience any worsening of your condition.  We encourage you to follow up with a primary care provider.  Thank you for allowing us to be a part of your care.

## 2018-08-16 ENCOUNTER — Ambulatory Visit (HOSPITAL_COMMUNITY)
Admission: EM | Admit: 2018-08-16 | Discharge: 2018-08-16 | Disposition: A | Discharge status: Home / Self Care | Payer: Medicaid Other | Attending: Radiology | Admitting: Radiology

## 2018-08-16 ENCOUNTER — Encounter (HOSPITAL_COMMUNITY): Payer: Self-pay | Admitting: Emergency Medicine

## 2018-08-16 DIAGNOSIS — R102 Pelvic and perineal pain: Secondary | ICD-10-CM

## 2018-08-16 DIAGNOSIS — M545 Low back pain: Secondary | ICD-10-CM

## 2018-08-16 MED ORDER — KETOROLAC TROMETHAMINE 30 MG/ML IJ SOLN
INTRAMUSCULAR | Status: AC
  Filled 2018-08-16: qty 1

## 2018-08-16 MED ORDER — KETOROLAC TROMETHAMINE 30 MG/ML IJ SOLN
30.0000 mg | Freq: Once | INTRAMUSCULAR | Status: AC
  Administered 2018-08-16: 30 mg via INTRAMUSCULAR
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
## 2018-09-08 ENCOUNTER — Ambulatory Visit: Payer: Medicaid Other

## 2018-09-09 ENCOUNTER — Ambulatory Visit (INDEPENDENT_AMBULATORY_CARE_PROVIDER_SITE_OTHER): Payer: Self-pay | Admitting: Family Medicine

## 2018-09-09 ENCOUNTER — Other Ambulatory Visit: Payer: Self-pay

## 2018-09-09 VITALS — BP 112/78 | HR 73 | Temp 98.1°F | Ht 68.0 in | Wt 250.0 lb

## 2018-09-09 DIAGNOSIS — Z113 Encounter for screening for infections with a predominantly sexual mode of transmission: Secondary | ICD-10-CM

## 2018-09-09 DIAGNOSIS — Z23 Encounter for immunization: Secondary | ICD-10-CM

## 2018-09-09 DIAGNOSIS — N63 Unspecified lump in unspecified breast: Secondary | ICD-10-CM

## 2018-09-09 NOTE — Progress Notes (Signed)
     Subjective: Chief Complaint  Patient presents with  . Cyst     HPI: Ann [name] is a 22 y.o. presenting to clinic today to discuss the following:  Cyst Patient presents today for follow up for a long time recurring cyst on her left areola. She was seen for this about one year ago and had an ultrasound which suggested it was a benign cyst but they did recommend an ultrasound guided biopsy. Due to insurance coverage concerns the patient did not have this procedure done. She comes in today due to the cyst recently reforming and spontaneously rupturing with whitish-yellow purulent drainage. She denies any blood from her aerola or with the discharge. The cyst is not painful, does not involve the breast tissue, and she does not think it has been growing. It can be painful but it is no longer painful after the spontaneous rupture.   She denies fever, chills, or weight loss. She denies any blood in the urine or stool.  ROS noted in HPI.   Past Medical, Surgical, Social, and Family History Reviewed & Updated per EMR.   Pertinent Historical Findings include:   Social History   Tobacco Use  Smoking Status Never Smoker  Smokeless Tobacco Never Used    Objective: BP 112/78   Pulse 73   Temp 98.1 F (36.7 C) (Oral)   Ht 5\' 8"  (1.727 m)   Wt 250 lb (113.4 kg)   LMP 09/03/2018   SpO2 99%   BMI 38.01 kg/m  Vitals and nursing notes reviewed  Physical Exam Gen: Alert and Oriented x 3, NAD HEENT: Normocephalic, atraumatic Neck: trachea midline, no thyroidmegaly, no LAD Breast: No lymphadenopathy appreciated in the axilla bilaterally, dense breast tissue without any lumps, hard nodules, or masses. Breasts are symmetrical with no tenderness to palpation, no skin changes. CV: RRR, no murmurs, normal S1, S2 split Resp: CTAB, no wheezing, rales, or rhonchi, comfortable work of breathing Ext: no clubbing, cyanosis, or edema Skin: warm, dry, intact, no rashes   No results found  for this or any previous visit (from the past 72 hour(s)).  Assessment/Plan:  Breast mass in female Patient most likely has a cyst of the left nipple. No surrounding erythema, warmth, or tenderness to suggest cellulitis or infection of the duct. - Referral to Breast Center for U/S guided biopsy as this was recommended 1 year ago but patient could not follow up at that time due to cost concerns. - Follow up U/S results and make appropriate referral to surgery for removal if indicated   PATIENT EDUCATION PROVIDED: See AVS    Diagnosis and plan along with any newly prescribed medication(s) were discussed in detail with this patient today. The patient verbalized understanding and agreed with the plan. Patient advised if symptoms worsen return to clinic or ER.   Health Maintainance:   Orders Placed This Encounter  Procedures  . US BREAST ASPIRATION LEFT    Standing Status:   Future    Standing Expiration Date:   11/10/2019    Order Specific Question:   Reason for Exam (SYMPTOM  OR DIAGNOSIS REQUIRED)    Answer:   breast mass    Order Specific Question:   Preferred imaging location?    Answer:   [facility]  . Flu Vaccine QUAD 36+ mos IM    No orders of the defined types were placed in this encounter.    Jules Schick, DO 09/09/2018, 3:30 PM PGY-2 [facility]

## 2018-09-09 NOTE — Patient Instructions (Signed)
It was great to meet you today! Thank you for letting me participate in your care!  Today, we discussed your left breast mass which drained spontaneously on its own about 3 days ago. After my exam today and given the history I am reassured that this is probably a benign cyst. However, I have reordered the breast ultrasound aspiration procedure to do further testing.   Be well, Jules Schick, DO PGY-2, [facility]

## 2018-09-10 ENCOUNTER — Ambulatory Visit: Payer: Medicaid Other | Admitting: Family Medicine

## 2018-09-14 NOTE — Assessment & Plan Note (Signed)
Patient most likely has a cyst of the left nipple. No surrounding erythema, warmth, or tenderness to suggest cellulitis or infection of the duct. - Referral to Breast Center for U/S guided biopsy as this was recommended 1 year ago but patient could not follow up at that time due to cost concerns. - Follow up U/S results and make appropriate referral to surgery for removal if indicated

## 2018-09-15 ENCOUNTER — Other Ambulatory Visit: Payer: Self-pay

## 2018-09-15 ENCOUNTER — Other Ambulatory Visit: Payer: Self-pay | Admitting: Family Medicine

## 2018-09-15 ENCOUNTER — Emergency Department (HOSPITAL_COMMUNITY)
Admission: EM | Admit: 2018-09-15 | Discharge: 2018-09-15 | Disposition: A | Discharge status: Home / Self Care | Payer: No Typology Code available for payment source | Attending: Emergency Medicine | Admitting: Emergency Medicine

## 2018-09-15 ENCOUNTER — Encounter (HOSPITAL_COMMUNITY): Payer: Self-pay | Admitting: Emergency Medicine

## 2018-09-15 DIAGNOSIS — M545 Low back pain, unspecified: Secondary | ICD-10-CM

## 2018-09-15 DIAGNOSIS — N6002 Solitary cyst of left breast: Secondary | ICD-10-CM | POA: Diagnosis not present

## 2018-09-15 DIAGNOSIS — N63 Unspecified lump in unspecified breast: Secondary | ICD-10-CM

## 2018-09-15 DIAGNOSIS — H6691 Otitis media, unspecified, right ear: Secondary | ICD-10-CM | POA: Insufficient documentation

## 2018-09-15 DIAGNOSIS — H9201 Otalgia, right ear: Secondary | ICD-10-CM | POA: Diagnosis present

## 2018-09-15 MED ORDER — NAPROXEN 500 MG PO TABS: 500.0000 mg | ORAL_TABLET | Freq: Two times a day (BID) | ORAL | 0 refills | Status: DC

## 2018-09-15 MED ORDER — AMOXICILLIN-POT CLAVULANATE 875-125 MG PO TABS: 1.0000 | ORAL_TABLET | Freq: Two times a day (BID) | ORAL | 0 refills | Status: DC
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
## 2018-09-15 NOTE — Discharge Instructions (Signed)
You have a right inner ear infection please take antibiotics as directed for the next week.  These antibiotics will also help with any infection surrounding the cyst over your breast although it does not look infected today.  Follow-up with the breast center next week for your appointment as planned.  For your low back pain please continue using naproxen twice daily, you can also use over-the-counter salon pas lidocaine patches and please avoid heavy lifting.  If back pain is still not improving please follow-up with your primary care doctor.  Return to the emergency department for fevers, significantly worsening ear pain, neck stiffness or severe headache, significantly worsened back pain, persistent numbness and weakness in both legs, loss of control of your bowels or bladder or any other new or concerning symptoms or if you develop redness warmth over your surrounding breast.

## 2018-09-19 ENCOUNTER — Ambulatory Visit: Payer: Medicaid Other | Admitting: Family Medicine

## 2018-09-23 ENCOUNTER — Other Ambulatory Visit: Payer: Medicaid Other

## 2018-10-07 ENCOUNTER — Inpatient Hospital Stay: Admission: RE | Admit: 2018-10-07 | Payer: Medicaid Other | Source: Ambulatory Visit

## 2018-10-07 ENCOUNTER — Other Ambulatory Visit: Payer: Medicaid Other

## 2018-12-07 NOTE — Progress Notes (Signed)
  Subjective:   Patient ID: Ann [name]    DOB: 1996/11/02, 23 y.o. female   MRN: [id number]  Ann [name] is a 23 y.o. female with a history of AR, eczema, AUB, obesity here for   STD check Patient presents for STD check.  Notes that she has been having abnormally thick, white vaginal discharge with abnormal smell, itching, burning.  She has had vaginal yeast infections in the past with similar symptoms but has taken measures to prevent this with wearing cotton panties and showering after sex.  She usually has oral sex.  Her boyfriend has a genital wart and is concerned about herpes.  She has not noticed any rashes, sores, ulcers but did notice one bump in her genital area 2 days ago.  Is not currently on birth control.  Was previously on Nexplanon but she did not tolerate this.  She was on Sprintec in the past but does not want to continue this.  She denies any missed periods, LMP 11/21/2018.  Denies any pelvic or new back pain, no fevers.  Review of Systems:  Per HPI.  PMFSH, medications and smoking status reviewed.  Objective:   BP 122/87   Pulse 77   Temp 98.5 F (36.9 C) (Oral)   Ht 5\' 8"  (1.727 m)   Wt 254 lb (115.2 kg)   LMP 11/21/2018   SpO2 99%   BMI 38.62 kg/m  Vitals and nursing note reviewed.  General: Overweight female, in no acute distress with non-toxic appearance GYN:  External genitalia within normal limits, no rashes, bumps, sores, ulcers.  Vaginal mucosa pink, moist, normal rugae.  Nonfriable cervix with ectropion. No lesions or bleeding noted. Thick white discharge noted on speculum exam.  No cervical motion tenderness.   Skin: warm, dry, no rashes or lesions Extremities: warm and well perfused, normal tone MSK: ROM grossly intact, strength intact, gait normal Neuro: Alert and oriented, speech normal  Assessment & Plan:   Vaginal discharge Obtained wet prep, consistent with yeast, will prescribe diflucan.  Screen for STD (sexually transmitted  disease) Obtained, HIV, RPR, GC/CT, wet prep today, will call with results.  Safe sex practices reviewed.  Handout provided regarding birth control options, patient not interested in starting at this time.  Orders Placed This Encounter  Procedures  . HIV antibody (with reflex)  . RPR  . POCT Wet Prep [facility])   Meds ordered this encounter  Medications  . fluconazole (DIFLUCAN) 150 MG tablet    Sig: Take 1 tablet (150 mg total) by mouth once for 1 dose.    Dispense:  1 tablet    Refill:  0    Ellwood Dense, DO PGY-2, [facility] 12/08/2018 11:41 AM

## 2018-12-08 ENCOUNTER — Ambulatory Visit (INDEPENDENT_AMBULATORY_CARE_PROVIDER_SITE_OTHER): Payer: 59 | Admitting: Family Medicine

## 2018-12-08 ENCOUNTER — Encounter: Payer: Self-pay | Admitting: Family Medicine

## 2018-12-08 ENCOUNTER — Other Ambulatory Visit: Payer: Self-pay

## 2018-12-08 ENCOUNTER — Other Ambulatory Visit (HOSPITAL_COMMUNITY)
Admission: RE | Admit: 2018-12-08 | Discharge: 2018-12-08 | Disposition: A | Discharge status: Home / Self Care | Payer: 59 | Source: Ambulatory Visit | Attending: Family Medicine | Admitting: Family Medicine

## 2018-12-08 VITALS — BP 122/87 | HR 77 | Temp 98.5°F | Ht 68.0 in | Wt 254.0 lb

## 2018-12-08 DIAGNOSIS — Z114 Encounter for screening for human immunodeficiency virus [HIV]: Secondary | ICD-10-CM | POA: Diagnosis not present

## 2018-12-08 DIAGNOSIS — Z113 Encounter for screening for infections with a predominantly sexual mode of transmission: Secondary | ICD-10-CM

## 2018-12-08 DIAGNOSIS — N898 Other specified noninflammatory disorders of vagina: Secondary | ICD-10-CM | POA: Insufficient documentation

## 2018-12-08 DIAGNOSIS — B9689 Other specified bacterial agents as the cause of diseases classified elsewhere: Secondary | ICD-10-CM | POA: Insufficient documentation

## 2018-12-08 LAB — POCT WET PREP (WET MOUNT)
CLUE CELLS WET PREP WHIFF POC: NEGATIVE
Trichomonas Wet Prep HPF POC: ABSENT

## 2018-12-08 MED ORDER — FLUCONAZOLE 150 MG PO TABS: 150.0000 mg | ORAL_TABLET | Freq: Once | ORAL | 0 refills | Status: AC

## 2018-12-08 NOTE — Assessment & Plan Note (Addendum)
Obtained wet prep, consistent with yeast, will prescribe diflucan.

## 2018-12-08 NOTE — Patient Instructions (Signed)
It was great to see you!  Our plans for today:  - We are checking some labs today, we will call you or send you a letter if they are abnormal.  - Be sure to wear condoms all the time for the whole time in order to prevent STIs.  Take care and seek immediate care sooner if you develop any concerns.   Dr. [facility]

## 2018-12-08 NOTE — Assessment & Plan Note (Addendum)
Obtained, HIV, RPR, GC/CT, wet prep today, will call with results.  Safe sex practices reviewed.  Handout provided regarding birth control options, patient not interested in starting at this time.

## 2018-12-09 ENCOUNTER — Other Ambulatory Visit: Payer: Medicaid Other

## 2018-12-09 ENCOUNTER — Encounter: Payer: Self-pay | Admitting: Family Medicine

## 2018-12-09 LAB — HIV ANTIBODY (ROUTINE TESTING W REFLEX): HIV Screen 4th Generation wRfx: NONREACTIVE

## 2018-12-09 LAB — CERVICOVAGINAL ANCILLARY ONLY
CHLAMYDIA, DNA PROBE: NEGATIVE
NEISSERIA GONORRHEA: NEGATIVE
Trichomonas: NEGATIVE

## 2018-12-09 LAB — RPR: RPR Ser Ql: NONREACTIVE

## 2018-12-16 ENCOUNTER — Ambulatory Visit
Admission: RE | Admit: 2018-12-16 | Discharge: 2018-12-16 | Disposition: A | Discharge status: Home / Self Care | Payer: 59 | Source: Ambulatory Visit | Attending: Family Medicine | Admitting: Family Medicine

## 2018-12-16 ENCOUNTER — Other Ambulatory Visit: Payer: Self-pay | Admitting: Family Medicine

## 2018-12-16 DIAGNOSIS — N63 Unspecified lump in unspecified breast: Secondary | ICD-10-CM

## 2018-12-16 DIAGNOSIS — R599 Enlarged lymph nodes, unspecified: Secondary | ICD-10-CM

## 2019-03-23 ENCOUNTER — Ambulatory Visit: Payer: 59

## 2019-03-26 ENCOUNTER — Other Ambulatory Visit: Payer: Self-pay

## 2019-03-26 ENCOUNTER — Ambulatory Visit (INDEPENDENT_AMBULATORY_CARE_PROVIDER_SITE_OTHER): Payer: Self-pay | Admitting: Family Medicine

## 2019-03-26 ENCOUNTER — Other Ambulatory Visit (HOSPITAL_COMMUNITY)
Admission: RE | Admit: 2019-03-26 | Discharge: 2019-03-26 | Disposition: A | Discharge status: Home / Self Care | Payer: Medicaid Other | Source: Ambulatory Visit | Attending: Family Medicine | Admitting: Family Medicine

## 2019-03-26 VITALS — BP 108/70 | HR 97 | Temp 98.8°F

## 2019-03-26 DIAGNOSIS — Z202 Contact with and (suspected) exposure to infections with a predominantly sexual mode of transmission: Secondary | ICD-10-CM | POA: Insufficient documentation

## 2019-03-26 DIAGNOSIS — A599 Trichomoniasis, unspecified: Secondary | ICD-10-CM

## 2019-03-26 DIAGNOSIS — N760 Acute vaginitis: Secondary | ICD-10-CM | POA: Insufficient documentation

## 2019-03-26 DIAGNOSIS — A6004 Herpesviral vulvovaginitis: Secondary | ICD-10-CM | POA: Insufficient documentation

## 2019-03-26 HISTORY — DX: Contact with and (suspected) exposure to infections with a predominantly sexual mode of transmission: Z20.2

## 2019-03-26 LAB — POCT WET PREP (WET MOUNT)
Clue Cells Wet Prep Whiff POC: POSITIVE
WBC, Wet Prep HPF POC: >20

## 2019-03-26 LAB — POCT URINE PREGNANCY: Preg Test, Ur: NEGATIVE

## 2019-03-26 MED ORDER — ACYCLOVIR 400 MG PO TABS: 400.0000 mg | ORAL_TABLET | Freq: Three times a day (TID) | ORAL | 2 refills | Status: AC

## 2019-03-26 MED ORDER — METRONIDAZOLE 500 MG PO TABS: 500.0000 mg | ORAL_TABLET | Freq: Two times a day (BID) | ORAL | 0 refills | Status: AC

## 2019-03-26 MED ORDER — OXYCODONE-ACETAMINOPHEN 5-325 MG PO TABS: 1.0000 | ORAL_TABLET | Freq: Three times a day (TID) | ORAL | 0 refills | Status: AC | PRN

## 2019-03-26 NOTE — Assessment & Plan Note (Addendum)
Suspect HSV primary infection given history and multiple lesions in the genital area and in the vagina and on the cervix.  - Acyclovir 400mg  TID for 7 days. Patient given refills in the event of recurrent symptoms. Advised no need to start suppressive therapy unless she begins to feel another outbreak starting at which point she should restart Acyclovir 400mg  BID for 5-7 days. - 3 days of Percocet given as she is in extreme pain - Recommended Sitz bath to help relieve symptoms of pain and discomfort

## 2019-03-26 NOTE — Assessment & Plan Note (Signed)
HIV, RPR, GC/CC all pending. Will call patient when tests result.
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
## 2019-03-26 NOTE — Assessment & Plan Note (Signed)
Positive wet prep for Trichomoniasis after unprotected intercourse. Unsure if she is having symptoms from this or from a concurrent HSV infection. Regardless, she will need treatment for both. - Metronidazole 500mg  BID for 7 days. Patient educated on avoiding all alcohol while taking medication and for 24hours after finishing.

## 2019-03-26 NOTE — Patient Instructions (Signed)
It was great to see you today! Thank you for letting me participate in your care!  Today, we discussed your current concerns and I will call you with test results once they are available. Please take the medication as prescribed and after 7 days if you feel like the symptoms are starting to return please restart the medication at the first sign of symptoms.  You can also take Sitz baths to help with pain  Be well, Jules Schick, DO PGY-2, [facility]

## 2019-03-27 ENCOUNTER — Other Ambulatory Visit: Payer: Self-pay | Admitting: Family Medicine

## 2019-03-27 DIAGNOSIS — A749 Chlamydial infection, unspecified: Secondary | ICD-10-CM

## 2019-03-27 LAB — HIV ANTIBODY (ROUTINE TESTING W REFLEX): HIV Screen 4th Generation wRfx: NONREACTIVE

## 2019-03-27 LAB — CERVICOVAGINAL ANCILLARY ONLY
Chlamydia: POSITIVE — AB
Neisseria Gonorrhea: NEGATIVE

## 2019-03-27 LAB — RPR: RPR Ser Ql: NONREACTIVE

## 2019-03-27 MED ORDER — AZITHROMYCIN 250 MG PO TABS: 1000.0000 mg | ORAL_TABLET | Freq: Once | ORAL | Status: DC

## 2019-03-27 NOTE — Progress Notes (Signed)
Patient tested positive for Chlamydia. She is coming in on Monday and has nurse appointment to receive Azithromycin.  Will need retest in 3 months for test of cure.

## 2019-03-30 ENCOUNTER — Other Ambulatory Visit: Payer: Self-pay

## 2019-03-30 ENCOUNTER — Ambulatory Visit (INDEPENDENT_AMBULATORY_CARE_PROVIDER_SITE_OTHER): Payer: Self-pay | Admitting: *Deleted

## 2019-03-30 DIAGNOSIS — A6004 Herpesviral vulvovaginitis: Secondary | ICD-10-CM

## 2019-03-30 DIAGNOSIS — A749 Chlamydial infection, unspecified: Secondary | ICD-10-CM

## 2019-03-30 MED ORDER — AZITHROMYCIN 500 MG PO TABS
1000.0000 mg | ORAL_TABLET | Freq: Once | ORAL | Status: AC
  Administered 2019-03-30: 11:00:00 1000 mg via ORAL

## 2019-03-30 NOTE — Progress Notes (Signed)
Patient in nurse clinic today for STD treatment of Chlamydia.  Patient advised to abstain from sex for 7-10 days after treatment or when partner has been tested/treated.  Azithromycin 1 GM PO x 1 given aper Dr. Carollee Sires orders.    Patient to follow up in 2-3 months for re-screening.   Advised to use condoms with all sexual activity.    STD report form fax completed and faxed to [facility] Department at [phone] (STD department).    Patient questions why she was not "tested" for herpes at last visit as she wants "proof".  Explained (after speaking with Dr Leveda Anna) that with first breakout was not an ideal time to due to possibility of false positives.  Future order placed for blood draw and appt made for mid June. Patient verbalized understanding.  Jone Baseman, CMA

## 2019-04-07 ENCOUNTER — Ambulatory Visit: Payer: Medicaid Other

## 2019-04-27 ENCOUNTER — Ambulatory Visit: Payer: Medicaid Other | Admitting: Family Medicine

## 2019-04-30 IMAGING — US ULTRASOUND LEFT BREAST LIMITED
1 series · 8 of 8 positions shown · non-contrast
Comparison: 06/12/2013

CLINICAL DATA: Palpable mass in the left breast. The patient
reports that every 2 years she has a mass and drainage of pus and
blood from the left nipple.

EXAM:
ULTRASOUND OF THE LEFT BREAST

[Series 1: ultrasound left breast limited · 0.06mm/px · 8 of 8 slices shown]
[im 1/8]
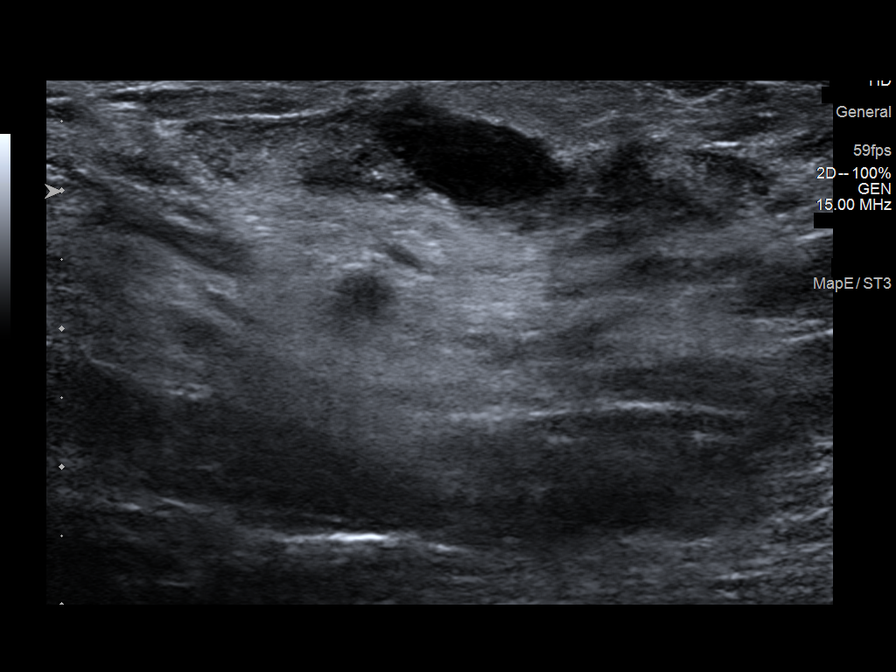
[im 2/8]
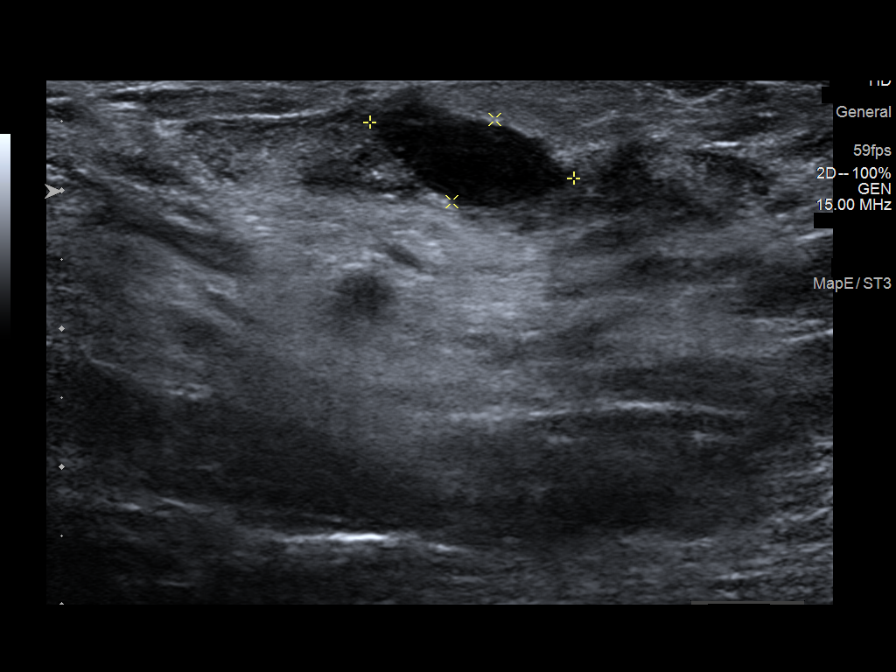
[im 3/8]
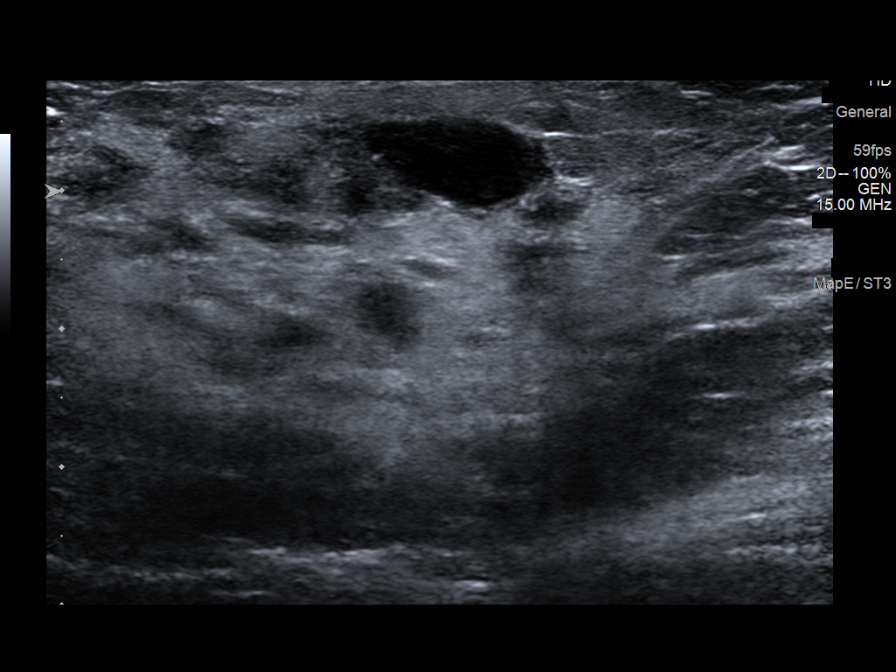
[im 4/8]
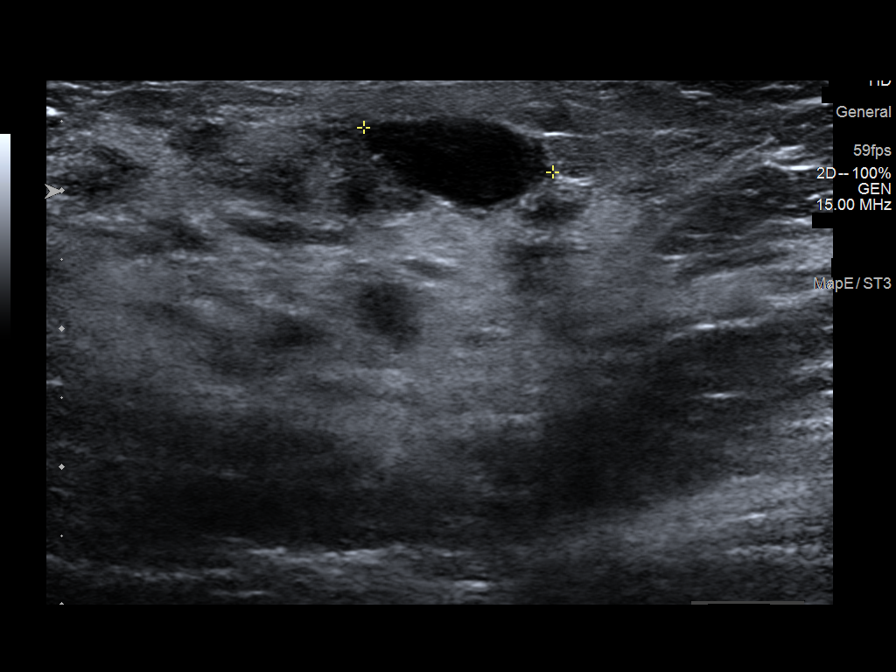
[im 5/8]
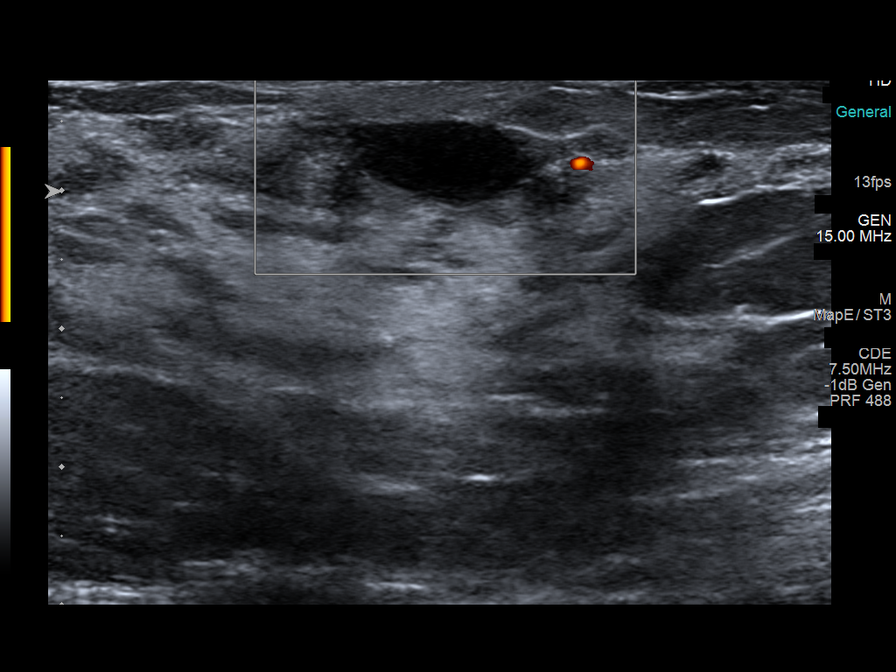
[im 6/8]
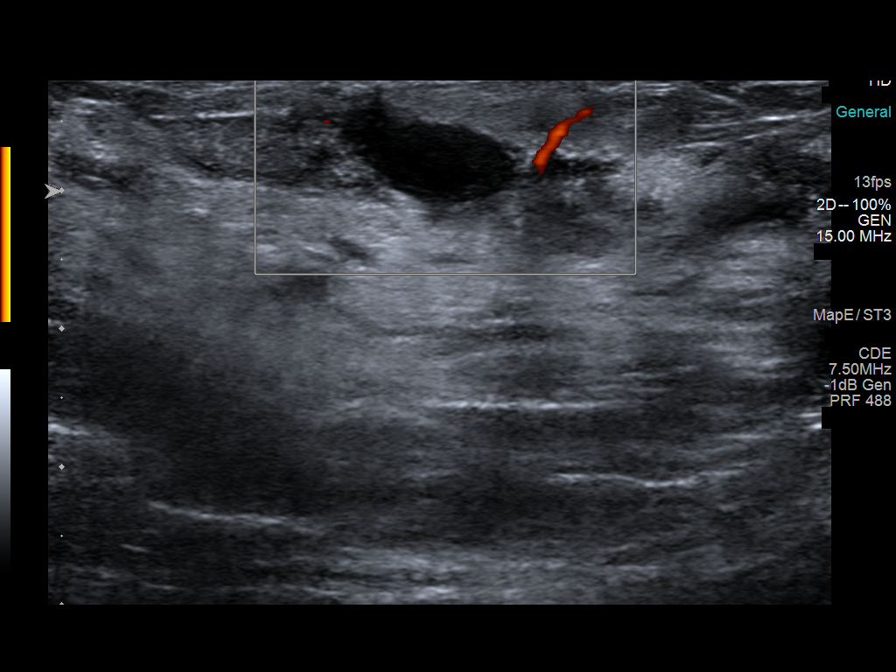
[im 7/8]
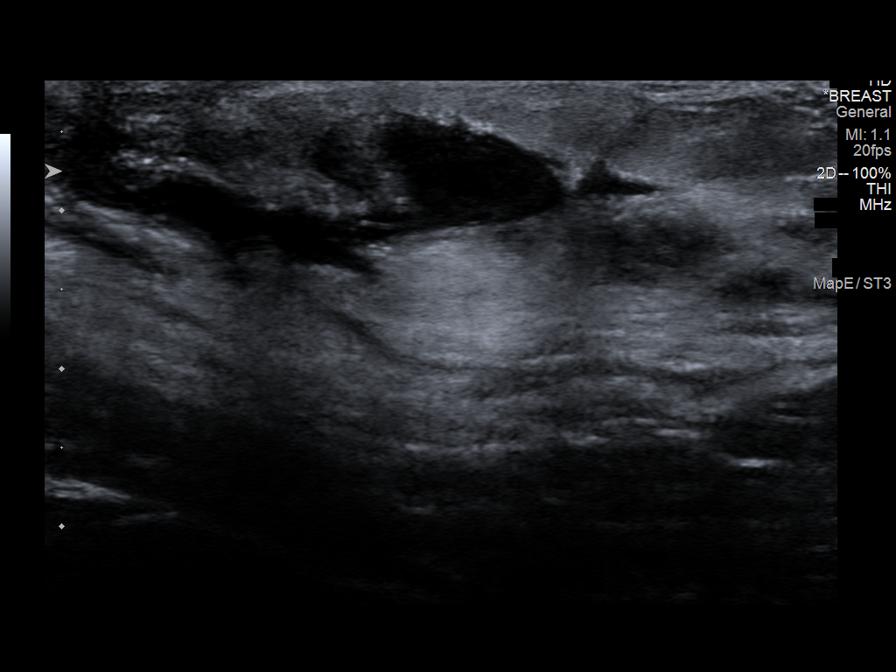
[im 8/8]
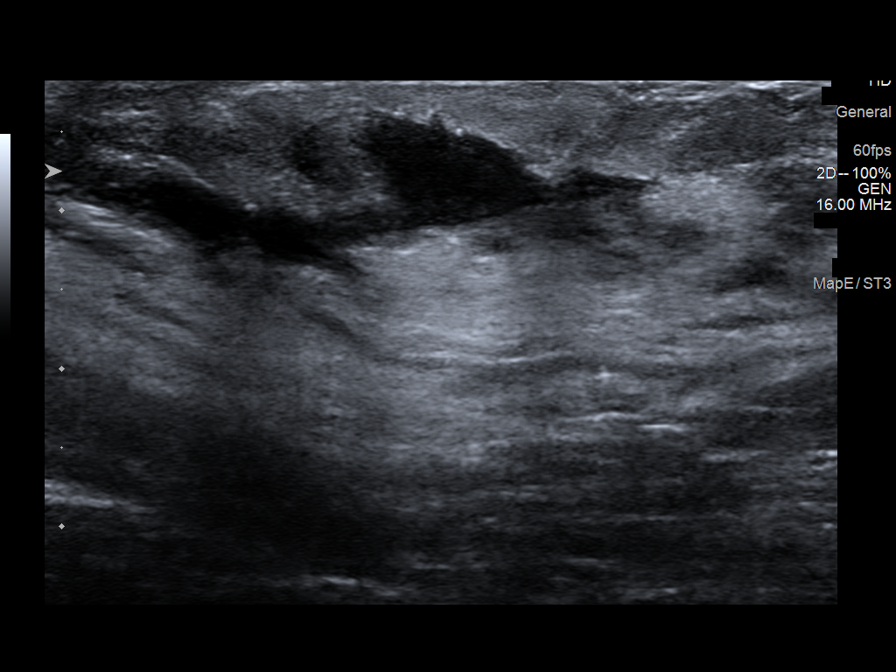

[8 of 8 positions shown; findings below may reference images not displayed]

FINDINGS: On physical exam, I palpate a rounded nontender mobile mass in the 2
o'clock location of the left breast, at the areolar margin. There is
no associated skin thickening or erythema.

Targeted ultrasound is performed, showing an oval hypoechoic
collection in the 2 o'clock location the left breast 3 cm from
nipple which measures 1.5 x 0.7 x 1.4 cm. Margins of the mass are
mildly irregular. Mass is immediately adjacent to a mildly ectatic
duct.
IMPRESSION: Findings are compatible with early abscess or mildly complicated
cyst. Aspiration is recommended for diagnostic and therapeutic
purposes. The patient will be evaluated for [REDACTED] eligibility in
return for possible aspiration next week.

RECOMMENDATION:
1. Antibiotic treatment. The patient is given a prescription for
doxycycline 100 mg 1 tablet by mouth twice daily for 7 days.
2. Ultrasound-guided aspiration of mass in the 2 o'clock location of
the left breast if it persists in 1 week.
3. Continued follow-up until mass resolves.

I have discussed the findings and recommendations with the patient.
Results were also provided in writing at the conclusion of the
visit. If applicable, a reminder letter will be sent to the patient
regarding the next appointment.

BI-RADS CATEGORY  3: Probably benign.

## 2019-05-18 ENCOUNTER — Other Ambulatory Visit: Payer: Medicaid Other

## 2019-05-18 ENCOUNTER — Other Ambulatory Visit (HOSPITAL_COMMUNITY)
Admission: RE | Admit: 2019-05-18 | Discharge: 2019-05-18 | Disposition: A | Discharge status: Home / Self Care | Payer: Medicaid Other | Source: Ambulatory Visit | Attending: Family Medicine | Admitting: Family Medicine

## 2019-05-18 ENCOUNTER — Other Ambulatory Visit: Payer: Self-pay

## 2019-05-18 DIAGNOSIS — A6004 Herpesviral vulvovaginitis: Secondary | ICD-10-CM | POA: Diagnosis present

## 2019-05-19 LAB — URINE CYTOLOGY ANCILLARY ONLY
Chlamydia: NEGATIVE
Neisseria Gonorrhea: NEGATIVE
Trichomonas: NEGATIVE

## 2019-05-20 ENCOUNTER — Ambulatory Visit (INDEPENDENT_AMBULATORY_CARE_PROVIDER_SITE_OTHER): Payer: Self-pay | Admitting: Family Medicine

## 2019-05-20 ENCOUNTER — Other Ambulatory Visit: Payer: Self-pay

## 2019-05-20 ENCOUNTER — Encounter: Payer: Self-pay | Admitting: Family Medicine

## 2019-05-20 ENCOUNTER — Other Ambulatory Visit (HOSPITAL_COMMUNITY)
Admission: RE | Admit: 2019-05-20 | Discharge: 2019-05-20 | Disposition: A | Discharge status: Home / Self Care | Payer: Medicaid Other | Source: Ambulatory Visit | Attending: Family Medicine | Admitting: Family Medicine

## 2019-05-20 VITALS — BP 112/64 | HR 101 | Ht 68.0 in | Wt 264.1 lb

## 2019-05-20 DIAGNOSIS — Z0000 Encounter for general adult medical examination without abnormal findings: Secondary | ICD-10-CM

## 2019-05-20 DIAGNOSIS — J029 Acute pharyngitis, unspecified: Secondary | ICD-10-CM

## 2019-05-20 DIAGNOSIS — A6004 Herpesviral vulvovaginitis: Secondary | ICD-10-CM

## 2019-05-20 DIAGNOSIS — N63 Unspecified lump in unspecified breast: Secondary | ICD-10-CM

## 2019-05-20 DIAGNOSIS — Z309 Encounter for contraceptive management, unspecified: Secondary | ICD-10-CM | POA: Insufficient documentation

## 2019-05-20 DIAGNOSIS — Z30011 Encounter for initial prescription of contraceptive pills: Secondary | ICD-10-CM

## 2019-05-20 MED ORDER — NORGESTIMATE-ETH ESTRADIOL 0.25-35 MG-MCG PO TABS: 1.0000 | ORAL_TABLET | Freq: Every day | ORAL | 11 refills | Status: DC

## 2019-05-20 NOTE — Assessment & Plan Note (Addendum)
Previously with nexplanon. Wants to try OCPs. LMP 2 days ago. Sent Rx for OCP to pharmacy with instructions to start after period. Discussed safe sex practices. Birth control handout provided. Obtained pap smear today.

## 2019-05-20 NOTE — Progress Notes (Signed)
Subjective:   Patient ID: Ann [name]    DOB: [birth year], 23 y.o. female   MRN: [id number]  Ann Latinombriya Deshea Fickling is a 23 y.o. female with a history of eczema, psoriasis, AUB, obesity here for   Pap smear - 1st pap smear - contraception: none. Previously had nexplanon, taken out 08/2017. Not currently sexually active but has been recently. Wears condoms every time she has sex. - took a plan B  - LMP 05/18/19 - lasts 5-6 days. Normal flow. Doesn't bleed btwn periods.   L breast mass - previously with breast abscess on lateral side in axillary region of L breast, drained 11/2018 with reactive lymph node biopsy. Did not get antibiotics. States she has slight lump where they did the biopsy that has been persistent although smaller since biopsy. No redness, pain, fevers.  - Still having numbness in L nipple. No discharge from nipple. No rashes.  Bump in throat - thinks she has herpes in throat - pain with swallowing, seeing bumps in throat - hasn't been to dentist recently - never had cold sore - previously had herpes outbreak on vagina. - previously had oral sex back in April, partner didn't know he had herpes - got acyclovir for vaginal outbreak, helped with pain - no bleeding or discharge. No fevers. - a little cough, not productive. No chest pain, SOB, heart burn - no nausea  Review of Systems:  Per HPI.  PMFSH, medications and smoking status reviewed.  Objective:   BP 112/64   Pulse (!) 101   Ht 5\' 8"  (1.727 m)   Wt 264 lb 2 oz (119.8 kg)   LMP 05/20/2019   SpO2 98%   BMI 40.16 kg/m  Vitals and nursing note reviewed.  General: obese female, in no acute distress with non-toxic appearance HEENT: normocephalic, atraumatic, moist mucous membranes. Oropharynx clear without erythema, exudate, lesions. Some hypertrophic taste buds on lateral side of tongue bilaterally without ulceration or discoloration. Breasts: right breast normal without mass, skin or nipple changes  or axillary nodes. L breast with surgical scars present in axillary region and below nipple, well healed with some underlying fibrous scar tissue. No rashes, mass, skin, or nipple changes. Neck: supple, non-tender without lymphadenopathy CV: regular rate and rhythm without murmurs, rubs, or gallops, no lower extremity edema Lungs: clear to auscultation bilaterally with normal work of breathing GYN:  External genitalia with few well healing previously ulcerated lesions.  Vaginal mucosa pink, moist, normal rugae.  Nonfriable cervix without lesions, some bleeding noted on speculum exam, no other discharge.  Bimanual exam revealed normal, nongravid uterus.  No cervical motion tenderness. No adnexal masses bilaterally.   Skin: warm, dry, no rashes or lesions Extremities: warm and well perfused, normal tone MSK: ROM grossly intact, strength intact, gait normal Neuro: Alert and oriented, speech normal  Assessment & Plan:   Herpes simplex vulvovaginitis Well healing. S/p acyclovir treatment.  Breast mass in female S/p US and I&D of abscess in 11/2018. No new signs of infection or mass on exam today. Some fibrous scar tissue appreciated underneath surgical scar in L axillary region and under L nipple. Numbness of L nipple persistent since abscess, counseled this may be persistent as she could have sustained damage to nerve during biopsy but may return. Counseled on watchful waiting and self breast exams.  Sore throat Concern for bumps in throat, appears to be hypertrophic taste buds bilaterally on tongue. No other lesions present in oropharynx or oral mucosa. Not erythematous, no exudate. No  fevers, sick contacts, or GI symptoms to suggest strep throat. No asymmetry to suggest mass or tumor. Given h/o oral sex and concern for oral herpes, will swab for culture though will likely be negative. 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 Counseled on safe sex  practices.  Contraception management Previously with nexplanon. Wants to try OCPs. LMP 2 days ago. Sent Rx for OCP to pharmacy with instructions to start after period. Discussed safe sex practices. Birth control handout provided. Obtained pap smear today.  No orders of the defined types were placed in this encounter.  Meds ordered this encounter  Medications  . norgestimate-ethinyl estradiol (ORTHO-CYCLEN) 0.25-35 MG-MCG tablet    Sig: Take 1 tablet by mouth daily.    Dispense:  1 Package    Refill:  [place], DO PGY-2, [facility] 05/20/2019 11:31 AM

## 2019-05-20 NOTE — Assessment & Plan Note (Signed)
Well healing. S/p acyclovir treatment.

## 2019-05-20 NOTE — Patient Instructions (Addendum)
It was great to see you!  Our plans for today:  - We did your pap smear today. We will call you or send a letter with these results. - I sent the birth control to your pharmacy. Take this after you finish your period. - Your breast exam was normal.  - if you aren't tolerating food or liquid, or develop fever, come back to see [place].   Take care and seek immediate care sooner if you develop any concerns.   Dr. [facility]

## 2019-05-20 NOTE — Assessment & Plan Note (Addendum)
S/p [place] and I&D of abscess in 11/2018. No new signs of infection or mass on exam today. Some fibrous scar tissue appreciated underneath surgical scar in L axillary region and under L nipple. Numbness of L nipple persistent since abscess, counseled this may be persistent as she could have sustained damage to nerve during biopsy but may return. Counseled on watchful waiting and self breast exams.

## 2019-05-20 NOTE — Assessment & Plan Note (Signed)
Concern for bumps in throat, appears to be hypertrophic taste buds bilaterally on tongue. No other lesions present in oropharynx or oral mucosa. Not erythematous, no exudate. No fevers, sick contacts, or GI symptoms to suggest strep throat. No asymmetry to suggest mass or tumor. Given h/o oral sex and concern for oral herpes, will swab for culture though will likely be negative. 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 Counseled on safe sex practices.

## 2019-05-21 LAB — CERVICOVAGINAL ANCILLARY ONLY
Chlamydia: NEGATIVE
Neisseria Gonorrhea: NEGATIVE

## 2019-05-21 LAB — CYTOLOGY - PAP
Chlamydia: NEGATIVE
Diagnosis: NEGATIVE
Neisseria Gonorrhea: NEGATIVE

## 2019-05-21 LAB — HSV(HERPES SMPLX)ABS-I+II(IGG+IGM)-BLD
HSV 1 Glycoprotein G Ab, IgG: 17.2 index — ABNORMAL HIGH (ref 0.00–0.90)
HSV 2 IgG, Type Spec: <0.91 index (ref 0.00–0.90)
HSVI/II Comb IgM: 2.1 Ratio — ABNORMAL HIGH (ref 0.00–0.90)

## 2019-06-23 ENCOUNTER — Other Ambulatory Visit: Payer: Self-pay

## 2019-06-23 ENCOUNTER — Other Ambulatory Visit (HOSPITAL_COMMUNITY)
Admission: RE | Admit: 2019-06-23 | Discharge: 2019-06-23 | Disposition: A | Discharge status: Home / Self Care | Payer: Medicaid Other | Source: Ambulatory Visit | Attending: Family Medicine | Admitting: Family Medicine

## 2019-06-23 ENCOUNTER — Encounter: Payer: Self-pay | Admitting: Family Medicine

## 2019-06-23 ENCOUNTER — Ambulatory Visit (INDEPENDENT_AMBULATORY_CARE_PROVIDER_SITE_OTHER): Payer: Medicaid Other | Admitting: Family Medicine

## 2019-06-23 VITALS — BP 112/62 | HR 110 | Ht 68.0 in | Wt 261.4 lb

## 2019-06-23 DIAGNOSIS — N3000 Acute cystitis without hematuria: Secondary | ICD-10-CM

## 2019-06-23 DIAGNOSIS — R399 Unspecified symptoms and signs involving the genitourinary system: Secondary | ICD-10-CM

## 2019-06-23 DIAGNOSIS — N390 Urinary tract infection, site not specified: Secondary | ICD-10-CM | POA: Insufficient documentation

## 2019-06-23 LAB — POCT UA - MICROSCOPIC ONLY
Epithelial cells, urine per micros: >20
WBC, Ur, HPF, POC: >20

## 2019-06-23 LAB — POCT URINALYSIS DIP (MANUAL ENTRY)
Bilirubin, UA: NEGATIVE
Glucose, UA: NEGATIVE mg/dL
Ketones, POC UA: NEGATIVE mg/dL
Nitrite, UA: NEGATIVE
Protein Ur, POC: NEGATIVE mg/dL
Spec Grav, UA: 1.02 (ref 1.010–1.025)
Urobilinogen, UA: 0.2 E.U./dL
pH, UA: 7 (ref 5.0–8.0)

## 2019-06-23 MED ORDER — PHENAZOPYRIDINE HCL 95 MG PO TABS: 95.0000 mg | ORAL_TABLET | Freq: Three times a day (TID) | ORAL | 0 refills | Status: DC | PRN

## 2019-06-23 MED ORDER — CEPHALEXIN 500 MG PO CAPS: 500.0000 mg | ORAL_CAPSULE | Freq: Four times a day (QID) | ORAL | 0 refills | Status: AC

## 2019-06-23 NOTE — Progress Notes (Deleted)
  Subjective:   Patient ID: Ann [name]    DOB: 23-Apr-1996, 23 y.o. female   MRN: [id number]  Ann [name] is a 23 y.o. female with a history of *** here for   *** - ***  Review of Systems:  Per HPI.  [place], medications and smoking status reviewed.  Objective:   BP 112/62   Pulse (!) 110   Ht 5\' 8"  (1.727 m)   Wt 261 lb 6 oz (118.6 kg)   LMP 06/23/2019 Comment: currently on   SpO2 98%   BMI 39.74 kg/m  Vitals and nursing note reviewed.  General: well nourished, well developed, in no acute distress with non-toxic appearance HEENT: normocephalic, atraumatic, moist mucous membranes Neck: supple, non-tender without lymphadenopathy CV: regular rate and rhythm without murmurs, rubs, or gallops, no lower extremity edema Lungs: clear to auscultation bilaterally with normal work of breathing Abdomen: soft, non-tender, non-distended, no masses or organomegaly palpable, normoactive bowel sounds Skin: warm, dry, no rashes or lesions Extremities: warm and well perfused, normal tone MSK: ROM grossly intact, strength intact, gait normal Neuro: Alert and oriented, speech normal  Assessment & Plan:   No problem-specific Assessment & Plan notes found for this encounter.  Orders Placed This Encounter  Procedures  . POCT urinalysis dipstick  . POCT UA - Microscopic Only   No orders of the defined types were placed in this encounter.   Rory Percy, DO PGY-3, [facility] 06/23/2019 3:55 PM

## 2019-06-23 NOTE — Progress Notes (Addendum)
Date of Visit: 06/23/2019   CC: UTI symptoms  HPI:  Ann [name] is a 23 year old female with a past history of self-resolved UTI presenting with 3 day history of incomplete voiding and increased urinary frequency. She reports tingling with urination but no vaginal burning or discharge. Her urine has a pink tinge which could be related to her period. The last day of her mensuration was 2 days ago.   She has tried drinking more water, apple cider vinger and a probiotic drink which has helped in the past but not helped today.  She reports a day history of chest heaviness/pain that radiates to her pelvis and belly pain that radiates to her lower back. Nothing seems to make her pain worse or better.   Denies fever, SOB, and joint pain. Some nausea without vomiting.   Patient has a friend with a kidney infection and she is worried she has the same thing. She uses condoms and is not worried about STIs.   PHX: Herpes simplex vulvovaginitis, UTIs, STI exposure   SHX: Occasionally drinks wine. Is sexually active. Has prescription for oral contraceptives which she is not currently using. Uses barrier contraceptives instead.   ROS:   Constitutional: Fatigue and trouble sleeping. No fevers. Cardiovasculare: No chest pain with excretion. Chest heaviness.  Respiratory: No SOB, or cough. GI: Bowel movement once daily. Some belly pain.  GU: Incomplete voiding, increased frequency, some pain with urination.  MSK/Skin: No joint pain or swelling. No changes in skin.    PHYSICAL EXAM: BP 112/62   Pulse (!) 110   Ht 5\' 8"  (1.727 m)   Wt 261 lb 6 oz (118.6 kg)   LMP 06/23/2019 Comment: currently on   SpO2 98%   BMI 39.74 kg/m  Gen: Alert and oriented 3X. Not in acute distress.  HEENT: Normocephalic head without cervical LAD. MMM. PERL.  Heart: S1S2 RRR. No murmurs or gallops.  Lungs: CTA bilaterally without wheezes or crackles. Good work of breathing.  Abd: Soft abdomen. Diffusely tender to  palpation. 4/10 CVA tenderness.   Ext: No lower extremity edema. Not cyanotic.   Labs:  Office Visit on 06/23/2019  Component Date Value Ref Range Status  . Color, UA 06/23/2019 yellow  yellow Final  . Clarity, UA 06/23/2019 cloudy* clear Final  . Glucose, UA 06/23/2019 negative  negative mg/dL Final  . Bilirubin, UA 06/23/2019 negative  negative Final  . Ketones, POC UA 06/23/2019 negative  negative mg/dL Final  . Spec Grav, UA 06/23/2019 1.020  1.010 - 1.025 Final  . Blood, UA 06/23/2019 moderate* negative Final  . pH, UA 06/23/2019 7.0  5.0 - 8.0 Final  . Protein Ur, POC 06/23/2019 negative  negative mg/dL Final  . Urobilinogen, UA 06/23/2019 0.2  0.2 or 1.0 E.U./dL Final  . Nitrite, UA 40/98/119107/28/2020 Negative  Negative Final  . Leukocytes, UA 06/23/2019 Large (3+)* Negative Final  . WBC, Ur, HPF, POC 06/23/2019 >20   Final  . RBC, urine, microscopic 06/23/2019 NONE   Final  . Bacteria, U Microscopic 06/23/2019 1+   Final  . Epithelial cells, urine per micros 06/23/2019 >20   Final     ASSESSMENT/PLAN:  UTI (urinary tract infection) Ann Regalmbriya Bentz is a 23 year old sexually active female presenting with a 3 day history of incomplete voiding and increased urinary frequency. She denies fever, nausea, vomiting. On PE, mild CVA tenderness and diffuse abdominal tenderness. On UA, urine was cloudy with moderate blood and 3+ leukocytes. G/C being collected. Her presentation, finding on  PE and UA put acute cystitis first on my differential. Pylonephritis is less likely considering she had no fever. STIs and pregnancy are lower on the differential because of her use of condoms. Cephalexin was prescribed for the infection and AZO for pain.    FOLLOW UP: Follow up if symptoms don't improve or if she develops fever.    Vernice Jefferson MS3, Medical Student    I personally saw and evaluated the patient, performing the key elements of the service. I developed and verified the management plan  that is described in the medical student's note, and I agree with the content with my edits above.   Additionally, patient was counseled on the importance of regular contraception.  Bedside ultrasound was done of bilateral kidneys for learning purposes.  Will call with results of urine culture and G/C.  Rory Percy, DO PGY-3, [facility] 06/23/2019 5:45 PM

## 2019-06-23 NOTE — Progress Notes (Signed)
  I personally saw and evaluated the patient, performing the key elements of the service. I developed and verified the management plan that is described in the medical student's note, and I agree with the content.   In addition, patient was counseled on importance of regular contraception. Bedside ultrasound of bilateral kidneys completed today for learning purposes. Will call patient with results of urine culture, G/C.  Rory Percy, DO PGY-3, [facility] 06/23/2019 5:38 PM

## 2019-06-23 NOTE — Assessment & Plan Note (Signed)
Ann [name] is a 23 year old sexually active female presenting with a 3 day history of incomplete voiding and increased urinary frequency. She denies fever, nausea, vomiting. On PE, mild CVA tenderness and diffuse abdominal tenderness. On UA, urine was cloudy with moderate blood and 3+ leukocytes. G/C being collected. Her presentation, finding on PE and UA put acute cystitis first on my differential. Pylonephritis is less likely considering she had no fever. STIs and pregnancy are lower on the differential because of her use of condoms. Cephalexin was prescribed for the infection and AZO for pain.

## 2019-06-23 NOTE — Patient Instructions (Signed)
It was great to see you!  Our plans for today:  - Take the antibiotic as directed until they run out. - Take azo as needed for pain. - Come back if your back pain gets worse or if you have fever.  We are checking some labs today, we will call you or send you a letter if they are abnormal.   Take care and seek immediate care sooner if you develop any concerns.   Dr. [facility]

## 2019-06-25 LAB — URINE CYTOLOGY ANCILLARY ONLY
Chlamydia: NEGATIVE
Neisseria Gonorrhea: NEGATIVE
Trichomonas: NEGATIVE

## 2019-06-25 LAB — URINE CULTURE

## 2019-07-01 ENCOUNTER — Ambulatory Visit: Payer: Medicaid Other | Admitting: Family Medicine

## 2019-07-01 ENCOUNTER — Encounter: Payer: Self-pay | Admitting: Family Medicine

## 2019-07-01 NOTE — Progress Notes (Deleted)
  Subjective:   Patient ID: Ann [name]    DOB: [birth year], 23 y.o. female   MRN: [id number]  Ann [name] is a 23 y.o. female with a history of syncopal episodes, allergic rhinitis, eczema, psorasis, AUB, HSV, obesity, breast mass here for   Medication concern - ***  Review of Systems:  Per HPI.  [place], medications and smoking status reviewed.  Objective:   LMP 06/23/2019 Comment: currently on  Vitals and nursing note reviewed.  General: well nourished, well developed, in no acute distress with non-toxic appearance HEENT: normocephalic, atraumatic, moist mucous membranes Neck: supple, non-tender without lymphadenopathy CV: regular rate and rhythm without murmurs, rubs, or gallops, no lower extremity edema Lungs: clear to auscultation bilaterally with normal work of breathing Abdomen: soft, non-tender, non-distended, no masses or organomegaly palpable, normoactive bowel sounds Skin: warm, dry, no rashes or lesions Extremities: warm and well perfused, normal tone MSK: ROM grossly intact, strength intact, gait normal Neuro: Alert and oriented, speech normal  Assessment & Plan:   No problem-specific Assessment & Plan notes found for this encounter.  No orders of the defined types were placed in this encounter.  No orders of the defined types were placed in this encounter.   Rory Percy, DO PGY-3, [facility] 07/01/2019 8:03 AM

## 2019-07-02 ENCOUNTER — Ambulatory Visit: Payer: Medicaid Other | Admitting: Family Medicine

## 2019-08-17 ENCOUNTER — Ambulatory Visit: Payer: Medicaid Other | Admitting: Family Medicine

## 2019-08-17 NOTE — Progress Notes (Deleted)
  Subjective:   Patient ID: Ann [name]    DOB: [birth year], 23 y.o. female   MRN: [id number]  Ann [name] is a 23 y.o. female with a history of allergic rhinitis, eczema, psoriasis, AUB, HSV, obesity here for   HPV vaccine?*** - ***  The 3-dose series (given at 0, 1-2, and 6 months) is recommended for females initiating HPV vaccination between ages 71 and 26 years  Review of Systems:  Per HPI.  Medications and smoking status reviewed.  Objective:   There were no vitals taken for this visit. Vitals and nursing note reviewed.  General: well nourished, well developed, in no acute distress with non-toxic appearance HEENT: normocephalic, atraumatic, moist mucous membranes Neck: supple, non-tender without lymphadenopathy CV: regular rate and rhythm without murmurs, rubs, or gallops, no lower extremity edema Lungs: clear to auscultation bilaterally with normal work of breathing Abdomen: soft, non-tender, non-distended, no masses or organomegaly palpable, normoactive bowel sounds Skin: warm, dry, no rashes or lesions Extremities: warm and well perfused, normal tone MSK: ROM grossly intact, gait normal Neuro: Alert and oriented, speech normal  Assessment & Plan:   No problem-specific Assessment & Plan notes found for this encounter.  No orders of the defined types were placed in this encounter.  No orders of the defined types were placed in this encounter.   Rory Percy, DO PGY-3, [facility] 08/17/2019 8:21 AM

## 2019-08-18 ENCOUNTER — Encounter: Payer: Self-pay | Admitting: Family Medicine

## 2019-08-18 ENCOUNTER — Other Ambulatory Visit: Payer: Self-pay

## 2019-08-18 ENCOUNTER — Ambulatory Visit (INDEPENDENT_AMBULATORY_CARE_PROVIDER_SITE_OTHER): Payer: Medicaid Other | Admitting: Family Medicine

## 2019-08-18 VITALS — BP 106/60 | HR 73 | Ht 68.0 in | Wt 260.2 lb

## 2019-08-18 DIAGNOSIS — L0292 Furuncle, unspecified: Secondary | ICD-10-CM | POA: Insufficient documentation

## 2019-08-18 DIAGNOSIS — Z30011 Encounter for initial prescription of contraceptive pills: Secondary | ICD-10-CM | POA: Diagnosis not present

## 2019-08-18 MED ORDER — CETIRIZINE HCL 10 MG PO TABS: 10.0000 mg | ORAL_TABLET | Freq: Every day | ORAL | 11 refills | Status: DC

## 2019-08-18 MED ORDER — NORGESTIMATE-ETH ESTRADIOL 0.25-35 MG-MCG PO TABS: 1.0000 | ORAL_TABLET | Freq: Every day | ORAL | 11 refills | Status: DC

## 2019-08-18 MED ORDER — FLUTICASONE PROPIONATE 50 MCG/ACT NA SUSP: 2.0000 | Freq: Every day | NASAL | 6 refills | Status: DC

## 2019-08-18 NOTE — Assessment & Plan Note (Addendum)
Wants to try OCPs.  Currently on her menstrual cycle with LMP 08/13/2019.  Has not had sex since the onset of her menses.  Will start on OCPs.  Counseled to stop smoking and discussed risk of blood clots including obesity, smoking.  Safe sex practices reviewed.

## 2019-08-18 NOTE — Assessment & Plan Note (Addendum)
Located in genital region, is not currently at a head.  Patient concern for possible HSV although antibody testing negative for HSV-2 and exam not consistent with vesicular eruption, no current indication for antiviral treatment.  Advised symptomatic and conservative treatment as well as safe sex practices, see AVS for details.

## 2019-08-18 NOTE — Patient Instructions (Signed)
It was great to see you!  Our plans for today:  - It is likely you do not have genital herpes simplex. Your blood test was positive for HSV1 which tends to be more of an oral herpes (like cold sores). Your lesions look more like boils. Be sure to use sharp razors and keep area clean and dry to prevent infection. - We refilled your allergy medications and birth control pills. - It is very important to wear condoms every time you have sex.  - It is very important to STOP smoking to prevent your risk for developing blood clots.  Take care and seek immediate care sooner if you develop any concerns.   Dr. [facility]

## 2019-08-18 NOTE — Progress Notes (Signed)
  Subjective:   Patient ID: Ann [name]    DOB: 12/21/1995, 23 y.o. female   MRN: [id number]  Lashay Osborne is a 23 y.o. female with a history of allergic rhinitis, eczema, psoriasis, IUP, HSV, obesity here for   Bump in genital area -Reports for the past 2 weeks, she has noticed 1 painful lesion that bled for 1 day.  Also reports 3 current bumps, but is not sure if it is a wart or herpes, they have also been there for 2 weeks.  She has had this before and was presumed herpes and given acyclovir. - no vaginal discharge. - wears condoms.  - Contraception: Has had nexplanon in the past, had for about 1 year, had heavy bleeding.  Prescribed OCPs in June but did not take these.  She wants to try OCPs. -Smokes and electric Juul, 3 times per day. 5% nicotine. - LMP 08/13/19, on cycle currently.  - last sex last Monday  Review of Systems:  Per HPI.  Medications and smoking status reviewed.  Objective:   BP 106/60   Pulse 73   Ht 5\' 8"  (1.727 m)   Wt 260 lb 4 oz (118 kg)   LMP 08/13/2019   SpO2 98%   BMI 39.57 kg/m  Vitals and nursing note reviewed.  General: Obese female, in no acute distress with non-toxic appearance GYN: Normal-appearing external genitalia.  Shaved mons pubis.  Few well-healed prior boils.  1 boil without fluctuance but with slight induration present over lower anterior portion of mons pubis.  No vesicles or erythema appreciated.  Assessment & Plan:   Contraception management Wants to try OCPs.  Currently on her menstrual cycle with LMP 08/13/2019.  Has not had sex since the onset of her menses.  Will start on OCPs.  Counseled to stop smoking and discussed risk of blood clots including obesity, smoking.  Safe sex practices reviewed.  Boil Located in genital region, is not currently at a head.  Patient concern for possible HSV although antibody testing negative for HSV-2 and exam not consistent with vesicular eruption, no current indication for  antiviral treatment.  Advised symptomatic and conservative treatment as well as safe sex practices, see AVS for details.  No orders of the defined types were placed in this encounter.  Meds ordered this encounter  Medications  . cetirizine (ZYRTEC) 10 MG tablet    Sig: Take 1 tablet (10 mg total) by mouth daily.    Dispense:  30 tablet    Refill:  11  . fluticasone (FLONASE) 50 MCG/ACT nasal spray    Sig: Place 2 sprays into both nostrils daily.    Dispense:  16 g    Refill:  6  . norgestimate-ethinyl estradiol (ORTHO-CYCLEN) 0.25-35 MG-MCG tablet    Sig: Take 1 tablet by mouth daily.    Dispense:  1 Package    Refill:  [place], DO PGY-3, [facility] 08/18/2019 12:24 PM

## 2019-08-26 ENCOUNTER — Telehealth (INDEPENDENT_AMBULATORY_CARE_PROVIDER_SITE_OTHER): Payer: Self-pay | Admitting: Family Medicine

## 2019-08-26 ENCOUNTER — Other Ambulatory Visit: Payer: Self-pay

## 2019-08-26 DIAGNOSIS — B9789 Other viral agents as the cause of diseases classified elsewhere: Secondary | ICD-10-CM

## 2019-08-26 DIAGNOSIS — J069 Acute upper respiratory infection, unspecified: Secondary | ICD-10-CM | POA: Insufficient documentation

## 2019-08-26 MED ORDER — ONDANSETRON 4 MG PO TBDP: 4.0000 mg | ORAL_TABLET | Freq: Three times a day (TID) | ORAL | 0 refills | Status: DC | PRN

## 2019-08-26 MED ORDER — ACETAMINOPHEN 500 MG PO TABS: 500.0000 mg | ORAL_TABLET | Freq: Four times a day (QID) | ORAL | 0 refills | Status: DC | PRN

## 2019-08-26 MED ORDER — ALBUTEROL SULFATE HFA 108 (90 BASE) MCG/ACT IN AERS: 2.0000 | INHALATION_SPRAY | RESPIRATORY_TRACT | 0 refills | Status: DC | PRN

## 2019-08-26 MED ORDER — IBUPROFEN 200 MG PO TABS: 200.0000 mg | ORAL_TABLET | Freq: Four times a day (QID) | ORAL | 0 refills | Status: DC | PRN

## 2019-08-26 NOTE — Progress Notes (Signed)
[place] Telemedicine Visit  Patient consented to have virtual visit. Method of visit: Video  Encounter participants: Patient: Ann [name] - located at [facility] [phone]) Provider: Daisy Floro - located at [facility]  [facility] Others (if applicable): none  Chief Complaint: congestion, upper respiratory symptoms  HPI:  Patient calls in reporting 3 days of congestion, hot flashes, throwing up, and upper respiratory symptoms, including productive cough, sneezing, shortness of breath, headache, body aches, and chills.  She does not have a thermometer at home, does not know if she has had fevers.  She was previously tested for COVID 2 months ago but this resulted negative.  She has been using Tylenol severe cold to help her symptoms.  Her mom recently went to the store to pick up VapoRub and TheraFlu.  She has been trying to drink warm water and apple juice to stay hydrated, but is frequently vomiting.  States that anything that touches her stomach comes right back up.  ROS: per HPI  Pertinent PMHx: non-contributory  Exam:  Respiratory: normal work of breathing, speaking in full sentences   Assessment/Plan:  Viral upper respiratory tract infection with cough -We will test patient for COVID -Tylenol 500 mg with Advil 200 mg every 6 hours as needed for pain, headache, body aches -Zofran dissolvable tablets for nausea -Patient encouraged to stay hydrated -Flonase 1 puff to each nostril every morning -1 tablespoon of honey 3 times daily as needed for cough suppression -Albuterol every 6 hours as needed for shortness of breath    Time spent during visit with patient: 22:30 minutes  Milus Banister, [place], PGY-2 08/26/2019 6:17 PM

## 2019-08-26 NOTE — Assessment & Plan Note (Signed)
-  We will test patient for COVID -Tylenol 500 mg with Advil 200 mg every 6 hours as needed for pain, headache, body aches -Zofran dissolvable tablets for nausea -Patient encouraged to stay hydrated -Flonase 1 puff to each nostril every morning -1 tablespoon of honey 3 times daily as needed for cough suppression -Albuterol every 6 hours as needed for shortness of breath

## 2019-08-26 NOTE — Assessment & Plan Note (Deleted)
Patient on day 8 of URI with fever, runny nose, congestion, body aches, and sore throat.  -Use flonase nasal spray daily  -Cetirizine 10mg daily to control allergies and drainage -Tylenol 500mg with Advil 200mg every 4-6 hours as needed for pain, fevers, headaches, and body aches -Recommend a Z-Pack if patient condition does not improve or worsens by this Friday, October 2nd. 

## 2019-08-28 ENCOUNTER — Other Ambulatory Visit: Payer: Self-pay

## 2019-08-28 DIAGNOSIS — Z20822 Contact with and (suspected) exposure to covid-19: Secondary | ICD-10-CM

## 2019-08-29 LAB — NOVEL CORONAVIRUS, NAA: SARS-CoV-2, NAA: NOT DETECTED

## 2019-10-28 ENCOUNTER — Ambulatory Visit: Payer: Medicaid Other

## 2019-11-02 ENCOUNTER — Other Ambulatory Visit: Payer: Self-pay

## 2019-11-02 ENCOUNTER — Other Ambulatory Visit (HOSPITAL_COMMUNITY)
Admission: RE | Admit: 2019-11-02 | Discharge: 2019-11-02 | Disposition: A | Discharge status: Home / Self Care | Payer: Medicaid Other | Source: Ambulatory Visit | Attending: Family Medicine | Admitting: Family Medicine

## 2019-11-02 ENCOUNTER — Encounter: Payer: Self-pay | Admitting: Family Medicine

## 2019-11-02 ENCOUNTER — Ambulatory Visit (INDEPENDENT_AMBULATORY_CARE_PROVIDER_SITE_OTHER): Payer: Medicaid Other | Admitting: Family Medicine

## 2019-11-02 VITALS — HR 117 | Wt 260.0 lb

## 2019-11-02 DIAGNOSIS — Z23 Encounter for immunization: Secondary | ICD-10-CM

## 2019-11-02 DIAGNOSIS — B373 Candidiasis of vulva and vagina: Secondary | ICD-10-CM

## 2019-11-02 DIAGNOSIS — N760 Acute vaginitis: Secondary | ICD-10-CM | POA: Diagnosis not present

## 2019-11-02 DIAGNOSIS — A599 Trichomoniasis, unspecified: Secondary | ICD-10-CM

## 2019-11-02 DIAGNOSIS — N898 Other specified noninflammatory disorders of vagina: Secondary | ICD-10-CM

## 2019-11-02 DIAGNOSIS — J069 Acute upper respiratory infection, unspecified: Secondary | ICD-10-CM

## 2019-11-02 DIAGNOSIS — B9689 Other specified bacterial agents as the cause of diseases classified elsewhere: Secondary | ICD-10-CM

## 2019-11-02 DIAGNOSIS — Z113 Encounter for screening for infections with a predominantly sexual mode of transmission: Secondary | ICD-10-CM

## 2019-11-02 DIAGNOSIS — B3731 Acute candidiasis of vulva and vagina: Secondary | ICD-10-CM

## 2019-11-02 DIAGNOSIS — R350 Frequency of micturition: Secondary | ICD-10-CM | POA: Diagnosis not present

## 2019-11-02 LAB — POCT URINALYSIS DIP (MANUAL ENTRY)
Bilirubin, UA: NEGATIVE
Blood, UA: NEGATIVE
Glucose, UA: NEGATIVE mg/dL
Ketones, POC UA: NEGATIVE mg/dL
Nitrite, UA: NEGATIVE
Protein Ur, POC: NEGATIVE mg/dL
Spec Grav, UA: 1.025 (ref 1.010–1.025)
Urobilinogen, UA: 0.2 E.U./dL
pH, UA: 7 (ref 5.0–8.0)

## 2019-11-02 LAB — POCT WET PREP (WET MOUNT): Clue Cells Wet Prep Whiff POC: POSITIVE

## 2019-11-02 LAB — POCT URINE PREGNANCY: Preg Test, Ur: NEGATIVE

## 2019-11-02 MED ORDER — FLUCONAZOLE 150 MG PO TABS: ORAL_TABLET | ORAL | 0 refills | Status: DC

## 2019-11-02 MED ORDER — METRONIDAZOLE 500 MG PO TABS: 500.0000 mg | ORAL_TABLET | Freq: Two times a day (BID) | ORAL | 0 refills | Status: AC

## 2019-11-02 MED ORDER — ALBUTEROL SULFATE HFA 108 (90 BASE) MCG/ACT IN AERS: 2.0000 | INHALATION_SPRAY | RESPIRATORY_TRACT | 2 refills | Status: DC | PRN

## 2019-11-02 NOTE — Progress Notes (Signed)
Subjective:   Patient ID: Ann [name]    DOB: [birth year], 23 y.o. female   MRN: [id number]  Ann [name] is a 23 y.o. female here for vaginal Discharge.  Vaginal Discharge: White-brown thick discharge x 1 week, also endorses area is very itchy. Denies any dysuria but there is pain with wiping and increased frequency. Denies hematuria. Denies any treatment. Sexually active with 4 sexual partners this past year, only men, uses condoms for contraception most of the time but not every time. LMP 10/11/19, lasts 5 days, heavy initially then tapers off. Last unprotected sex was 10/29/19. Previous birth control options was nexplanon but did not like side effects such as nausea, dizziness, hair loss, and weight gain. Patient is very interested in birth control pills but would like to wait till February. She plans to make appointment in February to discuss further. Last pap-smear was on 05/20/19 that was negative. Has history of HSV1. Denies any sores in genital area.   Review of Systems:  Per HPI.   [place], medications and smoking status reviewed.  Objective:   Pulse (!) 117    Wt 260 lb (117.9 kg)    SpO2 98%    BMI 39.53 kg/m  Vitals and nursing note reviewed.  General: pleasant young female, sitting comfortably on exam bed, well nourished, well developed, in no acute distress with non-toxic appearance Resp: Breathing comfortably on room air, speaking in full sentences Skin: warm, dry Extremities: warm and well perfused MSK:  gait normal Neuro:  speech normal Pelvic exam: VULVA: normal appearing vulva with no masses, tenderness or lesions, VAGINA: normal appearing vagina with normal color, no lesions, vaginal discharge present - white and creamy, CERVIX: normal appearing cervix without lesions, cervical discharge present - white and creamy, no cervical motion tenderness, WET MOUNT done - results: positive whiff, clue cells, hyphae, trichomonads, GC/Cl pending. Exam chaperoned by  Faythe Dingwall.  Assessment & Plan:   Vaginal discharge Obtained Wet prep. Positive for Trich, Yeast, and BV. Rx for Diflucan and Metronidazole 500mg  BID x 7 days provided. UA negative for infection, UPT negative. GC/Cl negative.  Patient educated on avoiding all alcohol while taking medication and for 24 hours after finishing.  Patient instructed to have partner treated for trichomonas prior to having sexual intercourse again. Patient instructed to have partner go to health department to get evaluated. She understood and agreed Discussed safe sex practices including condom use for every sexual encounter. Patient understood and agreed.   RTC in February for annual visit and contraception discussion   STD Testing:  (+) Trich. Patient informed. See plan for above.  GC/CL negative.  HIV/RPR pending.   Health Maintenance: Due for TDAP and Flu. Declined Flu. TDAP given this visit.  Orders Placed This Encounter  Procedures   Tdap vaccine greater than or equal to 7yo IM   HIV antibody (with reflex)    Standing Status:   Future    Standing Expiration Date:   11/01/2020   RPR    Standing Status:   Future    Standing Expiration Date:   11/01/2020   POCT Wet Prep [facility])   POCT urinalysis dipstick   POCT urine pregnancy   Meds ordered this encounter  Medications   albuterol (VENTOLIN HFA) 108 (90 Base) MCG/ACT inhaler    Sig: Inhale 2 puffs into the lungs every 4 (four) hours as needed for wheezing or shortness of breath.    Dispense:  8 g    Refill:  2   metroNIDAZOLE (FLAGYL) 500 MG tablet    Sig: Take 1 tablet (500 mg total) by mouth 2 (two) times daily for 7 days.    Dispense:  14 tablet    Refill:  0   fluconazole (DIFLUCAN) 150 MG tablet    Sig: Take 1 tablet (150mg ) now, then another dose in 72 hours.    Dispense:  2 tablet    Refill:  0    , DO PGY-2, [facility] 11/03/2019 4:00 PM

## 2019-11-02 NOTE — Patient Instructions (Signed)
Thank you so much for coming in today.  I have called in two prescriptions for you. It will be important for you to complete these entirely. Refrain from sexual intercourse until your partner is treated. Please ensure to use condoms to protect from future STDs and pregnancy. Please make an appointment for February for an annual exam and to discuss birth control. I will call you with the remainder of your results if they are abnormal, otherwise I will send a letter.   Metronidazole 500mg : Take 1 tablet twice a day for 7 days. Refrain from drinking alcohol while taking this medicine.  Diflucan 150mg : Take 1 tablet today, then the other tablet in 72 hours.   Please call our clinic if your symptoms fail to improve.  Take care, Dr. Tarry Kos

## 2019-11-03 LAB — CERVICOVAGINAL ANCILLARY ONLY
Chlamydia: NEGATIVE
Comment: NEGATIVE
Comment: NORMAL
Neisseria Gonorrhea: NEGATIVE

## 2019-11-03 NOTE — Assessment & Plan Note (Addendum)
Obtained Wet prep. Positive for Trich, Yeast, and BV. Rx for Diflucan and Metronidazole 500mg  BID x 7 days provided. UA negative for infection, UPT negative. GC/Cl negative.  Patient educated on avoiding all alcohol while taking medication and for 24 hours after finishing.  Patient instructed to have partner treated for trichomonas prior to having sexual intercourse again. Patient instructed to have partner go to health department to get evaluated. She understood and agreed Discussed safe sex practices including condom use for every sexual encounter. Patient understood and agreed.   RTC in February for annual visit and contraception discussion

## 2019-11-25 ENCOUNTER — Ambulatory Visit: Payer: Medicaid Other | Admitting: Family Medicine

## 2019-12-14 ENCOUNTER — Ambulatory Visit (INDEPENDENT_AMBULATORY_CARE_PROVIDER_SITE_OTHER): Payer: Medicaid Other | Admitting: Family Medicine

## 2019-12-14 DIAGNOSIS — Z5329 Procedure and treatment not carried out because of patient's decision for other reasons: Secondary | ICD-10-CM

## 2019-12-14 NOTE — Progress Notes (Deleted)
   Subjective:    Patient ID: Ann [name], female    DOB: [birth year], 24 y.o.   MRN: [id number]   CC: STD testing  HPI:   Smoking status reviewed  Review of Systems   Objective:  There were no vitals taken for this visit. Vitals and nursing note reviewed  PHYSICAL EXAM: General: well nourished, in no acute distress HEENT: normocephalic, TM's visualized bilaterally, no scleral icterus or conjunctival pallor, no nasal discharge, moist mucous membranes, good dentition without erythema or discharge noted in posterior oropharynx Neck: supple, non-tender, without lymphadenopathy Cardiac: RRR, clear S1 and S2, no murmurs, rubs, or gallops Respiratory: clear to auscultation bilaterally, no increased work of breathing Abdomen: soft, nontender, nondistended, no masses or organomegaly. Bowel sounds present Extremities: no edema or cyanosis. Warm, well perfused. 2+ radial and PT pulses bilaterally Skin: warm and dry, no rashes noted Neuro: alert and oriented, no focal deficits   Assessment & Plan:   No problem-specific Assessment & Plan notes found for this encounter.    No follow-ups on file.   Dr. Peggyann Shoals [facility], PGY-2

## 2019-12-19 DIAGNOSIS — Z91199 Patient's noncompliance with other medical treatment and regimen due to unspecified reason: Secondary | ICD-10-CM | POA: Insufficient documentation

## 2019-12-19 DIAGNOSIS — Z5329 Procedure and treatment not carried out because of patient's decision for other reasons: Secondary | ICD-10-CM | POA: Insufficient documentation

## 2019-12-19 NOTE — Progress Notes (Signed)
PATIENT NO-SHOWED 12/14/2019.  Peggyann Shoals, DO [facility], PGY-2 12/19/2019 3:32 PM

## 2019-12-25 ENCOUNTER — Other Ambulatory Visit (HOSPITAL_COMMUNITY)
Admission: RE | Admit: 2019-12-25 | Discharge: 2019-12-25 | Disposition: A | Discharge status: Home / Self Care | Payer: Medicaid Other | Source: Ambulatory Visit | Attending: Family Medicine | Admitting: Family Medicine

## 2019-12-25 ENCOUNTER — Encounter: Payer: Self-pay | Admitting: Family Medicine

## 2019-12-25 ENCOUNTER — Ambulatory Visit (INDEPENDENT_AMBULATORY_CARE_PROVIDER_SITE_OTHER): Payer: Medicaid Other | Admitting: Family Medicine

## 2019-12-25 ENCOUNTER — Other Ambulatory Visit: Payer: Self-pay

## 2019-12-25 VITALS — BP 114/72 | HR 83 | Wt 264.0 lb

## 2019-12-25 DIAGNOSIS — Z113 Encounter for screening for infections with a predominantly sexual mode of transmission: Secondary | ICD-10-CM | POA: Diagnosis not present

## 2019-12-25 DIAGNOSIS — Z30011 Encounter for initial prescription of contraceptive pills: Secondary | ICD-10-CM

## 2019-12-25 DIAGNOSIS — Z309 Encounter for contraceptive management, unspecified: Secondary | ICD-10-CM | POA: Diagnosis present

## 2019-12-25 LAB — POCT WET PREP (WET MOUNT)
Clue Cells Wet Prep Whiff POC: NEGATIVE
Trichomonas Wet Prep HPF POC: ABSENT

## 2019-12-25 LAB — POCT URINE PREGNANCY: Preg Test, Ur: NEGATIVE

## 2019-12-25 MED ORDER — NORETHINDRONE ACET-ETHINYL EST 1-20 MG-MCG PO TABS: 1.0000 | ORAL_TABLET | Freq: Every day | ORAL | 11 refills | Status: DC

## 2019-12-25 NOTE — Assessment & Plan Note (Addendum)
Assessment: Nonpregnant 24 year old female non-smoker who wishes to initiate oral contraceptive medication after previous appointments discussion the pros and cons of the various methods of contraception. Plan: -Discussed with patient risk and benefits of each of the various methods of contraception.  Patient expresses that she would like to try oral contraception starting today. -Have ordered patient Loestrin to start today -Discussed with patient the importance of using an additional form of contraception such as condoms during the first 7 days of initiating this OCP

## 2019-12-25 NOTE — Progress Notes (Signed)
   Subjective:    Patient ID: Ann [name], female    DOB: [birth year], 24 y.o.   MRN: [id number]   CC: Birth control and STD testing  HPI:   Contraception Ann [name] is a very pleasant 24 year old female who presents today discuss birth control options as well as STD testing.  She denies smoking, denies history of dvt, migraines, denies history of uterine/cervical/breast cancers.  Patient wishes to initiate an oral contraceptive medication starting this week in order to have her protected for her birthday next month when she wishes to be intimate with her new partner.  She states she has had Nexplanon in the past but not have a good experience with it.  She states that during a previous appointment she has discussed the different types of contraception and decided that she would like to start with oral contraception.  STD testing Patient also would like to discuss STD testing today.  She states she previously been diagnosed with trichomonas and BV with a previous partner and both her and her partner received treatment at that time.  She states that her symptoms improved but have not fully resolved since then with some vaginal itching still present.  She states that shortly after this she ended her relationship with this partner and has met someone else who she has not yet engaged in sexual intercourse with.  She states that she plans to initiate an intimate relationship with his partner on her birthday next month and would like to get tested as she has had some itching in her vaginal area similar to being treated before.  She states that over the past 12 months she has had 4 sexual partners.  ROS: pertinent noted in the HPI   Pertinent PMH, PSH, FH, SoHx: Previously had BV and trichomonas, has had treatment.  Smoking status -non-smoker  Objective:  BP 114/72   Pulse 83   Wt 264 lb (119.7 kg)   SpO2 99%   BMI 40.14 kg/m   Vitals and nursing note reviewed  General: NAD, pleasant,  able to participate in exam Cardiac: RRR, no murmurs. Respiratory: CTAB, normal effort Pelvic exam: Vulva: Normal appearing vulva with no masses, lesions.  Vagina: Normal appearing vagina with normal color, no lesions, some white, thick discharge present.  Cervix: Normal appearing, without lesions but with thick white discharge present.   Assessment & Plan:    Screen for STD (sexually transmitted disease) Assessment: Patient with vaginal itching and previously treated BV and trichomonas were both her and her partner were treated treatment.  The symptoms apparently did not improve fully since that treatment so she may have some residual itching.  Wet prep does not reveal any candidiasis, BV, trichomonas. Plan: -We will check for gonorrhea/chlamydia -We will check HIV and RPR   Encounter for contraceptive management Assessment: Nonpregnant 24 year old female non-smoker who wishes to initiate oral contraceptive medication after previous appointments discussion the pros and cons of the various methods of contraception. Plan: -Discussed with patient risk and benefits of each of the various methods of contraception.  Patient expresses that she would like to try oral contraception starting today. -Have ordered patient Loestrin to start today  -Will also check hepatitis C  Lurline Del, [place] [facility] PGY-1

## 2019-12-25 NOTE — Patient Instructions (Addendum)
It was great to see you!  Our plans for today:  -Today we discussed options for birth control including pills, IUDs, Nexplanon and others. -We are going to initiate oral birth control pills, Loestrin starting today. -I would like for you to take this starting today with Friday being your Day 1, please use additional contraception such as condoms for the first 7 days. -If you have any questions or concerns don't hesitate to reach out.  I will let you know the results of your tests within the next week.  Take care and seek immediate care sooner if you develop any concerns.   Dr. Day[facility]

## 2019-12-25 NOTE — Assessment & Plan Note (Addendum)
Assessment: Patient with vaginal itching and previously treated BV and trichomonas were both her and her partner were treated treatment.  The symptoms apparently did not improve fully since that treatment so she may have some residual itching.  Wet prep does not reveal any candidiasis, BV, trichomonas. Plan: -We will check for gonorrhea/chlamydia -We will check HIV and RPR

## 2019-12-26 LAB — HEPATITIS C ANTIBODY (REFLEX): HCV Ab: <0.1 s/co ratio (ref 0.0–0.9)

## 2019-12-26 LAB — HCV COMMENT:

## 2019-12-26 LAB — HIV ANTIBODY (ROUTINE TESTING W REFLEX): HIV Screen 4th Generation wRfx: NONREACTIVE

## 2019-12-26 LAB — RPR: RPR Ser Ql: NONREACTIVE

## 2019-12-29 ENCOUNTER — Ambulatory Visit: Payer: Medicaid Other | Admitting: Family Medicine

## 2019-12-29 LAB — CERVICOVAGINAL ANCILLARY ONLY
Chlamydia: NEGATIVE
Comment: NEGATIVE
Comment: NORMAL
Neisseria Gonorrhea: NEGATIVE

## 2020-03-18 ENCOUNTER — Other Ambulatory Visit: Payer: Medicaid Other
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
## 2020-05-25 ENCOUNTER — Ambulatory Visit: Payer: Medicaid Other | Admitting: Family Medicine

## 2020-05-25 NOTE — Progress Notes (Deleted)
    SUBJECTIVE:   CHIEF COMPLAINT / HPI:   STD check - *** - G***P*** - Menses: *** - Contraception: OCPs - Cancer screening: UTD, due 2023   PERTINENT  PMH / PSH: Allergic rhinitis, eczema, obesity  OBJECTIVE:   There were no vitals taken for this visit.  ***  ASSESSMENT/PLAN:   No problem-specific Assessment & Plan notes found for this encounter.     Ellwood Dense, DO [facility]

## 2020-05-31 ENCOUNTER — Other Ambulatory Visit: Payer: Self-pay

## 2020-05-31 ENCOUNTER — Ambulatory Visit: Payer: Medicaid Other

## 2020-06-02 ENCOUNTER — Encounter: Payer: Self-pay | Admitting: Family Medicine

## 2020-06-02 ENCOUNTER — Other Ambulatory Visit: Payer: Self-pay

## 2020-06-02 ENCOUNTER — Other Ambulatory Visit (HOSPITAL_COMMUNITY)
Admission: RE | Admit: 2020-06-02 | Discharge: 2020-06-02 | Disposition: A | Discharge status: Home / Self Care | Payer: Medicaid Other | Source: Ambulatory Visit | Attending: Family Medicine | Admitting: Family Medicine

## 2020-06-02 ENCOUNTER — Ambulatory Visit (INDEPENDENT_AMBULATORY_CARE_PROVIDER_SITE_OTHER): Payer: Self-pay | Admitting: Family Medicine

## 2020-06-02 VITALS — BP 102/66 | HR 85 | Ht 68.0 in | Wt 254.0 lb

## 2020-06-02 DIAGNOSIS — R109 Unspecified abdominal pain: Secondary | ICD-10-CM | POA: Insufficient documentation

## 2020-06-02 DIAGNOSIS — Z113 Encounter for screening for infections with a predominantly sexual mode of transmission: Secondary | ICD-10-CM

## 2020-06-02 DIAGNOSIS — N898 Other specified noninflammatory disorders of vagina: Secondary | ICD-10-CM

## 2020-06-02 DIAGNOSIS — A599 Trichomoniasis, unspecified: Secondary | ICD-10-CM

## 2020-06-02 LAB — POCT WET PREP (WET MOUNT)
Clue Cells Wet Prep Whiff POC: NEGATIVE
WBC, Wet Prep HPF POC: >20

## 2020-06-02 LAB — POCT URINE PREGNANCY: Preg Test, Ur: NEGATIVE

## 2020-06-02 MED ORDER — METRONIDAZOLE 500 MG PO TABS: 500.0000 mg | ORAL_TABLET | Freq: Two times a day (BID) | ORAL | 0 refills | Status: AC

## 2020-06-02 NOTE — Patient Instructions (Signed)
It was great seeing you today!  Please check-out at the front desk before leaving the clinic.   Visit Remembers: - Stop by the pharmacy to pick up your prescriptions  - Do not drink alcohol while taking this medication (Metronidazole)   Call us if: symptoms are not improving    Regarding lab work today:  Due to recent changes in healthcare laws, you may see the results of your imaging and laboratory studies on MyChart before your provider has had a chance to review them.  I understand that in some cases there may be results that are confusing or concerning to you. Not all laboratory results come back in the same time frame and you may be waiting for multiple results in order to interpret others.  Please give [place] 72 hours in order for your provider to thoroughly review all the results before contacting the office for clarification of your results. If everything is normal, you will get a letter in the mail or a message in My Chart. Please give [place] a call if you do not hear from [place] after 2 weeks.  Please bring all of your medications with you to each visit.    If you haven't already, sign up for My Chart to have easy access to your labs results, and communication with your primary care physician.  Feel free to call with any questions or concerns at any time, at [phone].   Take care,  Dr. Katherina Right [facility]

## 2020-06-02 NOTE — Progress Notes (Signed)
    SUBJECTIVE:   CHIEF COMPLAINT / HPI:   STI check - recently became sexually active with a new partner, wants to be checked for STIs. - Symptoms: waking up with abdominal pain similar to when she had Chlamydia  - Medications tried: None - Sexually active with 1 female partner - Last sexual encounter: 05/13/20  - Contraception: condoms "sometimes"  Symptoms - Abnormal vaginal discharge: no - Missed period: no Patient's last menstrual period was 05/07/2020. - Fever: no - Abdominal/Pelvic pain: YES  - Vaginal bleeding: no  - Pain during sex: no  - Rash: no   PERTINENT  PMH / PSH: Chlamydia, Trichomonas   OBJECTIVE:   BP 102/66   Pulse 85   Ht 5\' 8"  (1.727 m)   Wt 254 lb (115.2 kg)   LMP 05/07/2020   SpO2 97%   BMI 38.62 kg/m   GEN: well appearing female in no acute distress  CVS: well perfused  RESP: speaking in full sentences without pause  ABD: soft, non-tender, non-distended, no palpable masses  Pelvic exam: VULVA: normal appearing vulva with no masses, tenderness or lesions, VAGINA and CERVIX: vaginal and cervical discharge - white and thin, cervical os is closed, UTERUS: uterus is normal size, shape, consistency and nontender, ADNEXA: normal adnexa in size, nontender and no masses, WET MOUNT done - results: KOH done, trichomonads, DNA probe for chlamydia and GC obtained, exam chaperoned by CMA.   ASSESSMENT/PLAN:   Vaginal discharge Trichomoniasis  Confirmed on wet prep. G/C 07/07/2020 is pending. Symptoms consistent with this.  - Treatment: Flagyl 500 BID x 7 days and abstain from coitus during course of treatment. Advised patient to not drink alcohol while taking this medication. Advised patient to tell her partner and have him be treated as well. - F/U if symptoms not improving or getting worse.  - Will f/u on G/C Chlamydia and call in Rx if positive.  - Self care instructions given including avoiding douching. Handout given.  - F/U with PCP as needed.  - Return  precautions including abdominal pain, fever, chills, nausea, or vomiting given.   High risk heterosexual behavior GC and chlamydia DNA  probe sent to lab. HIV and RPR collected. Advised patient to use barrier protection/condoms.           Percell Locus, DO [facility]

## 2020-06-03 LAB — CERVICOVAGINAL ANCILLARY ONLY
Chlamydia: POSITIVE — AB
Comment: NEGATIVE
Comment: NORMAL
Neisseria Gonorrhea: POSITIVE — AB

## 2020-06-03 LAB — HIV ANTIBODY (ROUTINE TESTING W REFLEX): HIV Screen 4th Generation wRfx: NONREACTIVE

## 2020-06-03 LAB — RPR: RPR Ser Ql: NONREACTIVE

## 2020-06-03 NOTE — Assessment & Plan Note (Signed)
Trichomoniasis  Confirmed on wet prep. G/C Percell Locus is pending. Symptoms consistent with this.  - Treatment: Flagyl 500 BID x 7 days and abstain from coitus during course of treatment. Advised patient to not drink alcohol while taking this medication. Advised patient to tell her partner and have him be treated as well. - F/U if symptoms not improving or getting worse.  - Will f/u on G/C Chlamydia and call in Rx if positive.  - Self care instructions given including avoiding douching. Handout given.  - F/U with PCP as needed.  - Return precautions including abdominal pain, fever, chills, nausea, or vomiting given.   High risk heterosexual behavior GC and chlamydia DNA  probe sent to lab. HIV and RPR collected. Advised patient to use barrier protection/condoms.

## 2020-06-07 ENCOUNTER — Ambulatory Visit (INDEPENDENT_AMBULATORY_CARE_PROVIDER_SITE_OTHER): Payer: Self-pay | Admitting: *Deleted

## 2020-06-07 ENCOUNTER — Other Ambulatory Visit: Payer: Self-pay

## 2020-06-07 DIAGNOSIS — A549 Gonococcal infection, unspecified: Secondary | ICD-10-CM

## 2020-06-07 DIAGNOSIS — A749 Chlamydial infection, unspecified: Secondary | ICD-10-CM

## 2020-06-07 DIAGNOSIS — N763 Subacute and chronic vulvitis: Secondary | ICD-10-CM

## 2020-06-07 MED ORDER — CEFTRIAXONE SODIUM 250 MG IJ SOLR
250.0000 mg | Freq: Once | INTRAMUSCULAR | Status: AC
  Administered 2020-06-07: 250 mg via INTRAMUSCULAR

## 2020-06-07 MED ORDER — AZITHROMYCIN 500 MG PO TABS
1000.0000 mg | ORAL_TABLET | Freq: Once | ORAL | Status: AC
  Administered 2020-06-07: 1000 mg via ORAL

## 2020-06-07 NOTE — Progress Notes (Addendum)
Patient in nurse clinic today for STD treatment of Gonorrhea and Chlamydia.    Patient advised to abstain from sex for 7-10 days after treatment of self and partner.    Azithromycin 1 GM PO x 1 given and Ceftriaxone 250 mg IM x 2 (total 500mg ) given in Right and Left Upper outer Corner per Dr. orders.  Patient observed 15 minutes in office.  No reaction noted.   Patient to follow up in 2-3 months for re-screening.    Provided condoms and advised to use with all sexual activity. Patient verbalized understanding.   STD report form fax completed and faxed to [facility] Department at [phone] (STD department).      [phone], CMA

## 2020-06-08 NOTE — Progress Notes (Signed)
I spoke with Ann [name] with Ann [name], CMA. Ann [name] showed me a grainy phone photo of a circular, possible erosive, lesion on the labia minora.  She reported at a fold just superior to the labial hood had a linear skin fissue. Both lesions were reported as 8/10 pain.   She had seen GYN last year with similar lesions. She had serological HSV testing that showed antibody positivity for HSV 1, not HSV 2.  There were no viral cultures of the lesions.  She was not treated with antiviral medications.   Given the patient's Recent GC/Chlamydia/Trich positive diagnoses with negative RPR, genital herpes is quite possible.    Ann [name] requested referral to GYN to address the issue.  She did not want exam nor empiric treatment for possible acute, recurrent genital herpes.  Referral to GYN was ordered.

## 2020-06-22 ENCOUNTER — Encounter: Payer: Self-pay | Admitting: Family Medicine

## 2020-06-22 ENCOUNTER — Ambulatory Visit (INDEPENDENT_AMBULATORY_CARE_PROVIDER_SITE_OTHER): Payer: Self-pay | Admitting: Family Medicine

## 2020-06-22 ENCOUNTER — Other Ambulatory Visit (HOSPITAL_COMMUNITY)
Admission: RE | Admit: 2020-06-22 | Discharge: 2020-06-22 | Disposition: A | Discharge status: Home / Self Care | Payer: Medicaid Other | Source: Ambulatory Visit | Attending: Family Medicine | Admitting: Family Medicine

## 2020-06-22 ENCOUNTER — Other Ambulatory Visit: Payer: Self-pay

## 2020-06-22 VITALS — BP 102/60 | HR 83 | Wt 253.0 lb

## 2020-06-22 DIAGNOSIS — N760 Acute vaginitis: Secondary | ICD-10-CM

## 2020-06-22 DIAGNOSIS — A599 Trichomoniasis, unspecified: Secondary | ICD-10-CM | POA: Insufficient documentation

## 2020-06-22 DIAGNOSIS — Z113 Encounter for screening for infections with a predominantly sexual mode of transmission: Secondary | ICD-10-CM

## 2020-06-22 DIAGNOSIS — K625 Hemorrhage of anus and rectum: Secondary | ICD-10-CM

## 2020-06-22 LAB — POCT WET PREP (WET MOUNT)
Clue Cells Wet Prep Whiff POC: POSITIVE
Trichomonas Wet Prep HPF POC: ABSENT

## 2020-06-22 NOTE — Patient Instructions (Signed)
It was nice to see you today,   I will contact you when I get the results of your lab tests.  If necessary I will prescribe additional antibiotics at that time.    Have a great day,   Frederic Jericho, MD

## 2020-06-22 NOTE — Progress Notes (Signed)
    SUBJECTIVE:   CHIEF COMPLAINT / HPI:   STD: patient here for test of cure for trichomonas, gonorrhea, and chlamydia.  She has not had any symptoms since her treatment.  She denies discharge, itching, or burning. She has a small bump on the labia on the right side. She states she is going to see a gynecologist for this.  After the wrap-up, the patient stated she is having a decreased appetite in the evening.  This then leads to abdominal pain.  No nausea, vomiting, diarrhea.  The patient remember that she wanted to ask me about rectal pain and some bright red blood spotting when she wipes.  She states that it hurts sometimes when she has a bowel movement almost like a burning pain.  She states she has a history of HSV outbreak on her vagina.  Has not seen any lesions.  She states she will schedule a follow-up appointment to discuss this  PERTINENT  PMH / PSH: HSV  OBJECTIVE:   BP (!) 102/60   Pulse 83   Wt (!) 253 lb (114.8 kg)   LMP 06/04/2020 (Approximate)   SpO2 98%   BMI 38.47 kg/m   General: Alert, oriented.  No acute distress. GU: Some grayish-white discharge seen near cervix.  Small subcutaneous cyst palpated on the superior right labia majora.  No discharge from lesion.  GU exam otherwise normal. GI: No tenderness palpation.  ASSESSMENT/PLAN:   Vulvovaginitis Patient's wet prep shows both BV and yeast infection, but no trichomonas.  Will treat with fluconazole and metronidazole.  Additional fluconazole pill prescribed for after completion of metronidazole if patient experiences symptoms of yeast infection recurrence.  Routine screening for STI (sexually transmitted infection) Following up for test of cure for gonorrhea, chlamydia, trichomonas.  No trichomonas seen on wet prep, will follow up with results of gonorrhea chlamydia test.  BRBPR (bright red blood per rectum) Patient mention this during her wrap-up.  Just a couple spots of blood on the toilet paper with some  bowel movements.  Has an associated pain with bowel movement.  Likely hemorrhoids or possibly anal fissure, but could also represent HSV lesion given her history of vaginal herpes outbreak.  Patient to make follow-up appointment upon leaving today to discuss these issues.     Sandre Kitty, MD [facility]

## 2020-06-23 LAB — CERVICOVAGINAL ANCILLARY ONLY
Chlamydia: NEGATIVE
Comment: NEGATIVE
Comment: NEGATIVE
Comment: NORMAL
Neisseria Gonorrhea: NEGATIVE
Trichomonas: NEGATIVE

## 2020-06-24 ENCOUNTER — Telehealth: Payer: Self-pay | Admitting: Family Medicine

## 2020-06-24 MED ORDER — METRONIDAZOLE 500 MG PO TABS
500.0000 mg | ORAL_TABLET | Freq: Two times a day (BID) | ORAL | 0 refills | Status: AC
Start: 2020-06-24 — End: 2020-07-01

## 2020-06-24 MED ORDER — FLUCONAZOLE 150 MG PO TABS
150.0000 mg | ORAL_TABLET | Freq: Once | ORAL | 1 refills | Status: AC
Start: 2020-06-24 — End: 2020-06-24

## 2020-06-24 NOTE — Telephone Encounter (Signed)
She called back.  I informed her of results.  No need to call her again.

## 2020-06-24 NOTE — Telephone Encounter (Signed)
Called pt to discuss results of wet prep and sti testing, but no answer and unable to leave voicemail.  We will need to try calling her again.  Please let her know the following:   She tested positive for bacterial vaginosis and a yeast infection.  For this I have prescribed 7 days o ftwice a day metronidazole and one dose of fluconazole . I have added a refill of the fluconazole if she notices symptoms recurring after finishing her antibiotics.    Her other tests were negative for gonorrhea, chlamydia, and trichomonas.

## 2020-06-26 DIAGNOSIS — Z113 Encounter for screening for infections with a predominantly sexual mode of transmission: Secondary | ICD-10-CM | POA: Insufficient documentation

## 2020-06-26 DIAGNOSIS — K625 Hemorrhage of anus and rectum: Secondary | ICD-10-CM | POA: Insufficient documentation

## 2020-06-26 NOTE — Assessment & Plan Note (Signed)
Patient's wet prep shows both BV and yeast infection, but no trichomonas.  Will treat with fluconazole and metronidazole.  Additional fluconazole pill prescribed for after completion of metronidazole if patient experiences symptoms of yeast infection recurrence.

## 2020-06-26 NOTE — Assessment & Plan Note (Signed)
Patient mention this during her wrap-up.  Just a couple spots of blood on the toilet paper with some bowel movements.  Has an associated pain with bowel movement.  Likely hemorrhoids or possibly anal fissure, but could also represent HSV lesion given her history of vaginal herpes outbreak.  Patient to make follow-up appointment upon leaving today to discuss these issues.

## 2020-06-26 NOTE — Assessment & Plan Note (Signed)
Following up for test of cure for gonorrhea, chlamydia, trichomonas.  No trichomonas seen on wet prep, will follow up with results of gonorrhea chlamydia test.

## 2020-08-24 ENCOUNTER — Ambulatory Visit (INDEPENDENT_AMBULATORY_CARE_PROVIDER_SITE_OTHER): Payer: Self-pay | Admitting: Obstetrics & Gynecology

## 2020-08-24 ENCOUNTER — Other Ambulatory Visit: Payer: Self-pay

## 2020-08-24 ENCOUNTER — Encounter: Payer: Self-pay | Admitting: Obstetrics & Gynecology

## 2020-08-24 VITALS — BP 111/76 | HR 96 | Wt 252.5 lb

## 2020-08-24 DIAGNOSIS — Z3202 Encounter for pregnancy test, result negative: Secondary | ICD-10-CM

## 2020-08-24 DIAGNOSIS — A6000 Herpesviral infection of urogenital system, unspecified: Secondary | ICD-10-CM

## 2020-08-24 LAB — POCT PREGNANCY, URINE: Preg Test, Ur: NEGATIVE

## 2020-08-24 MED ORDER — VALACYCLOVIR HCL 1 G PO TABS: 1000.0000 mg | ORAL_TABLET | Freq: Every day | ORAL | 2 refills | Status: DC

## 2020-08-24 NOTE — Progress Notes (Signed)
Patient ID: Ann [name], female   DOB: [birth year], 24 y.o.   MRN: [id number]  Cc: question about herpes  HPI Ann [name] is a 24 y.o. female.  [id number] Patient's last menstrual period was 07/29/2020 (approximate). She was diagnosed with HSV 1 genital herpes in 2020 and had one other outbreak 9 months ago. No h/o labial lesions. No sx today.  HPI  Past Medical History:  Diagnosis Date  . Possible exposure to STD 03/26/2019    No past surgical history on file.  Family History  Problem Relation Age of Onset  . Diabetes Maternal Uncle   . Cancer Maternal Grandmother   . Hypertension Maternal Grandmother   . Heart disease Neg Hx     Social History Social History   Tobacco Use  . Smoking status: Never Smoker  . Smokeless tobacco: Never Used  Substance Use Topics  . Alcohol use: No  . Drug use: No    Allergies  Allergen Reactions  . Pollen Extract     Sneezing and congestion    Current Outpatient Medications  Medication Sig Dispense Refill  . acetaminophen (TYLENOL) 500 MG tablet Take 1 tablet (500 mg total) by mouth every 6 (six) hours as needed for moderate pain, fever or headache. (Patient not taking: Reported on 08/24/2020) 45 tablet 0  . albuterol (VENTOLIN HFA) 108 (90 Base) MCG/ACT inhaler Inhale 2 puffs into the lungs every 4 (four) hours as needed for wheezing or shortness of breath. (Patient not taking: Reported on 08/24/2020) 8 g 2  . cetirizine (ZYRTEC) 10 MG tablet Take 1 tablet (10 mg total) by mouth daily. (Patient not taking: Reported on 08/24/2020) 30 tablet 11  . ibuprofen (ADVIL) 200 MG tablet Take 1 tablet (200 mg total) by mouth every 6 (six) hours as needed. (Patient not taking: Reported on 08/24/2020) 45 tablet 0  . valACYclovir (VALTREX) 1000 MG tablet Take 1 tablet (1,000 mg total) by mouth daily. 5 tablet 2   No current facility-administered medications for this visit.    Review of Systems Review of Systems  Gastrointestinal:  Negative.   Endocrine: Negative.   Genitourinary: Negative.     Blood pressure 111/76, pulse 96, weight 252 lb 8 oz (114.5 kg), last menstrual period 07/29/2020.  Physical Exam Physical Exam Vitals and nursing note reviewed.  Constitutional:      Appearance: She is not ill-appearing.  Pulmonary:     Effort: Pulmonary effort is normal.  Abdominal:     General: There is no distension.  Neurological:     Mental Status: She is alert.  Psychiatric:        Mood and Affect: Mood normal.        Behavior: Behavior normal.     Data Reviewed Neg UPT, had pos HSV 1 on serology 04/2019  Assessment H/o genital HSV 1 outbreak last year Genital herpes simplex, unspecified site - Plan: valACYclovir (VALTREX) 1000 MG tablet    Plan  She was advised that frequent recurrence will be uncommon but I sent Rx for Valtrex.    Scheryl [name] 08/24/2020, 2:36 PM

## 2020-08-24 NOTE — Patient Instructions (Signed)
Contraception Choices Contraception, also called birth control, refers to methods or devices that prevent pregnancy. Hormonal methods Contraceptive implant  A contraceptive implant is a thin, plastic tube that contains a hormone. It is inserted into the upper part of the arm. It can remain in place for up to 3 years. Progestin-only injections Progestin-only injections are injections of progestin, a synthetic form of the hormone progesterone. They are given every 3 months by a health care provider. Birth control pills  Birth control pills are pills that contain hormones that prevent pregnancy. They must be taken once a day, preferably at the same time each day. Birth control patch  The birth control patch contains hormones that prevent pregnancy. It is placed on the skin and must be changed once a week for three weeks and removed on the fourth week. A prescription is needed to use this method of contraception. Vaginal ring  A vaginal ring contains hormones that prevent pregnancy. It is placed in the vagina for three weeks and removed on the fourth week. After that, the process is repeated with a new ring. A prescription is needed to use this method of contraception. Emergency contraceptive Emergency contraceptives prevent pregnancy after unprotected sex. They come in pill form and can be taken up to 5 days after sex. They work best the sooner they are taken after having sex. Most emergency contraceptives are available without a prescription. This method should not be used as your only form of birth control. Barrier methods Female condom  A female condom is a thin sheath that is worn over the penis during sex. Condoms keep sperm from going inside a woman's body. They can be used with a spermicide to increase their effectiveness. They should be disposed after a single use. Female condom  A female condom is a soft, loose-fitting sheath that is put into the vagina before sex. The condom keeps sperm  from going inside a woman's body. They should be disposed after a single use. Diaphragm  A diaphragm is a soft, dome-shaped barrier. It is inserted into the vagina before sex, along with a spermicide. The diaphragm blocks sperm from entering the uterus, and the spermicide kills sperm. A diaphragm should be left in the vagina for 6-8 hours after sex and removed within 24 hours. A diaphragm is prescribed and fitted by a health care provider. A diaphragm should be replaced every 1-2 years, after giving birth, after gaining more than 15 lb (6.8 kg), and after pelvic surgery. Cervical cap  A cervical cap is a round, soft latex or plastic cup that fits over the cervix. It is inserted into the vagina before sex, along with spermicide. It blocks sperm from entering the uterus. The cap should be left in place for 6-8 hours after sex and removed within 48 hours. A cervical cap must be prescribed and fitted by a health care provider. It should be replaced every 2 years. Sponge  A sponge is a soft, circular piece of polyurethane foam with spermicide on it. The sponge helps block sperm from entering the uterus, and the spermicide kills sperm. To use it, you make it wet and then insert it into the vagina. It should be inserted before sex, left in for at least 6 hours after sex, and removed and thrown away within 30 hours. Spermicides Spermicides are chemicals that kill or block sperm from entering the cervix and uterus. They can come as a cream, jelly, suppository, foam, or tablet. A spermicide should be inserted into the   vagina with an applicator at least 10-15 minutes before sex to allow time for it to work. The process must be repeated every time you have sex. Spermicides do not require a prescription. Intrauterine contraception Intrauterine device (IUD) An IUD is a T-shaped device that is put in a woman's uterus. There are two types:  Hormone IUD.This type contains progestin, a synthetic form of the hormone  progesterone. This type can stay in place for 3-5 years.  Copper IUD.This type is wrapped in copper wire. It can stay in place for 10 years.  Permanent methods of contraception Female tubal ligation In this method, a woman's fallopian tubes are sealed, tied, or blocked during surgery to prevent eggs from traveling to the uterus. Hysteroscopic sterilization In this method, a small, flexible insert is placed into each fallopian tube. The inserts cause scar tissue to form in the fallopian tubes and block them, so sperm cannot reach an egg. The procedure takes about 3 months to be effective. Another form of birth control must be used during those 3 months. Female sterilization This is a procedure to tie off the tubes that carry sperm (vasectomy). After the procedure, the man can still ejaculate fluid (semen). Natural planning methods Natural family planning In this method, a couple does not have sex on days when the woman could become pregnant. Calendar method This means keeping track of the length of each menstrual cycle, identifying the days when pregnancy can happen, and not having sex on those days. Ovulation method In this method, a couple avoids sex during ovulation. Symptothermal method This method involves not having sex during ovulation. The woman typically checks for ovulation by watching changes in her temperature and in the consistency of cervical mucus. Post-ovulation method In this method, a couple waits to have sex until after ovulation. Summary  Contraception, also called birth control, means methods or devices that prevent pregnancy.  Hormonal methods of contraception include implants, injections, pills, patches, vaginal rings, and emergency contraceptives.  Barrier methods of contraception can include female condoms, female condoms, diaphragms, cervical caps, sponges, and spermicides.  There are two types of IUDs (intrauterine devices). An IUD can be put in a woman's uterus to  prevent pregnancy for 3-5 years.  Permanent sterilization can be done through a procedure for males, females, or both.  Natural family planning methods involve not having sex on days when the woman could become pregnant. This information is not intended to replace advice given to you by your health care provider. Make sure you discuss any questions you have with your health care provider. Document Revised: 11/14/2017 Document Reviewed: 12/15/2016 Elsevier Patient Education  2020 [employer] Genital Herpes Genital herpes is a common sexually transmitted infection (STI) that is caused by a virus. The virus spreads from person to person through sexual contact. Infection can cause itching, blisters, and sores around the genitals or rectum. Symptoms may last several days and then go away This is called an outbreak. However, the virus remains in your body, so you may have more outbreaks in the future. The time between outbreaks varies and can be months or years. Genital herpes affects men and women. It is particularly concerning for pregnant women because the virus can be passed to the baby during delivery and can cause serious problems. Genital herpes is also a concern for people who have a weak disease-fighting (immune) system. What are the causes? This condition is caused by the herpes simplex virus (HSV) type 1 or type 2. The virus may  spread through:  Sexual contact with an infected person, including vaginal, anal, and oral sex.  Contact with fluid from a herpes sore.  The skin. This means that you can get herpes from an infected partner even if he or she does not have a visible sore or does not know that he or she is infected. What increases the risk? You are more likely to develop this condition if:  You have sex with many partners.  You do not use latex condoms during sex. What are the signs or symptoms? Most people do not have symptoms (asymptomatic) or have mild symptoms that may be  mistaken for other skin problems. Symptoms may include:  Small red bumps near the genitals, rectum, or mouth. These bumps turn into blisters and then turn into sores.  Flu-like symptoms, including: ? Fever. ? Body aches. ? Swollen lymph nodes. ? Headache.  Painful urination.  Pain and itching in the genital area or rectal area.  Vaginal discharge.  Tingling or shooting pain in the legs and buttocks. Generally, symptoms are more severe and last longer during the first (primary) outbreak. Flu-like symptoms are also more common during the primary outbreak. How is this diagnosed? Genital herpes may be diagnosed based on:  A physical exam.  Your medical history.  Blood tests.  A test of a fluid sample (culture) from an open sore. How is this treated? There is no cure for this condition, but treatment with antiviral medicines that are taken by mouth (orally) can do the following:  Speed up healing and relieve symptoms.  Help to reduce the spread of the virus to sexual partners.  Limit the chance of future outbreaks, or make future outbreaks shorter.  Lessen symptoms of future outbreaks. Your health care provider may also recommend pain relief medicines, such as aspirin or ibuprofen. Follow these instructions at home: Sexual activity  Do not have sexual contact during active outbreaks.  Practice safe sex. Latex condoms and female condoms may help prevent the spread of the herpes virus. General instructions  Keep the affected areas dry and clean.  Take over-the-counter and prescription medicines only as told by your health care provider.  Avoid rubbing or touching blisters and sores. If you do touch blisters or sores: ? Wash your hands thoroughly with soap and water. ? Do not touch your eyes afterward.  To help relieve pain or itching, you may take the following actions as directed by your health care provider: ? Apply a cold, wet cloth (cold compress) to affected  areas 4-6 times a day. ? Apply a substance that protects your skin and reduces bleeding (astringent). ? Apply a gel that helps relieve pain around sores (lidocaine gel). ? Take a warm, shallow bath that cleans the genital area (sitz bath).  Keep all follow-up visits as told by your health care provider. This is important. How is this prevented?  Use condoms. Although anyone can get genital herpes during sexual contact, even with the use of a condom, a condom can provide some protection.  Avoid having multiple sexual partners.  Talk with your sexual partner about any symptoms either of you may have. Also, talk with your partner about any history of STIs.  Get tested for STIs before you have sex. Ask your partner to do the same.  Do not have sexual contact if you have symptoms of genital herpes. Contact a health care provider if:  Your symptoms are not improving with medicine.  Your symptoms return.  You have new  symptoms.  You have a fever.  You have abdominal pain.  You have redness, swelling, or pain in your eye.  You notice new sores on other parts of your body.  You are a woman and experience bleeding between menstrual periods.  You have had herpes and you become pregnant or plan to become pregnant. Summary  Genital herpes is a common sexually transmitted infection (STI) that is caused by the herpes simplex virus (HSV) type 1 or type 2.  These viruses are most often spread through sexual contact with an infected person.  You are more likely to develop this condition if you have sex with many partners or you have unprotected sex.  Most people do not have symptoms (asymptomatic) or have mild symptoms that may be mistaken for other skin problems. Symptoms occur as outbreaks that may happen months or years apart.  There is no cure for this condition, but treatment with oral antiviral medicines can reduce symptoms, reduce the chance of spreading the virus to a partner,  prevent future outbreaks, or shorten future outbreaks. This information is not intended to replace advice given to you by your health care provider. Make sure you discuss any questions you have with your health care provider. Document Revised: 05/19/2018 Document Reviewed: 10/12/2016 Elsevier Patient Education  2020 [employer].

## 2020-08-31 ENCOUNTER — Ambulatory Visit (INDEPENDENT_AMBULATORY_CARE_PROVIDER_SITE_OTHER): Payer: Self-pay | Admitting: Family Medicine

## 2020-08-31 ENCOUNTER — Other Ambulatory Visit (HOSPITAL_COMMUNITY)
Admission: RE | Admit: 2020-08-31 | Discharge: 2020-08-31 | Disposition: A | Discharge status: Home / Self Care | Payer: Medicaid Other | Source: Ambulatory Visit | Attending: Family Medicine | Admitting: Family Medicine

## 2020-08-31 ENCOUNTER — Other Ambulatory Visit: Payer: Self-pay

## 2020-08-31 VITALS — BP 102/70 | HR 63 | Ht 68.0 in | Wt 255.0 lb

## 2020-08-31 DIAGNOSIS — Z7251 High risk heterosexual behavior: Secondary | ICD-10-CM

## 2020-08-31 DIAGNOSIS — B9689 Other specified bacterial agents as the cause of diseases classified elsewhere: Secondary | ICD-10-CM | POA: Insufficient documentation

## 2020-08-31 DIAGNOSIS — A6000 Herpesviral infection of urogenital system, unspecified: Secondary | ICD-10-CM

## 2020-08-31 DIAGNOSIS — R894 Abnormal immunological findings in specimens from other organs, systems and tissues: Secondary | ICD-10-CM | POA: Insufficient documentation

## 2020-08-31 DIAGNOSIS — N760 Acute vaginitis: Secondary | ICD-10-CM | POA: Insufficient documentation

## 2020-08-31 DIAGNOSIS — L239 Allergic contact dermatitis, unspecified cause: Secondary | ICD-10-CM

## 2020-08-31 LAB — POCT WET PREP (WET MOUNT)
Clue Cells Wet Prep Whiff POC: POSITIVE
Trichomonas Wet Prep HPF POC: ABSENT

## 2020-08-31 MED ORDER — VALACYCLOVIR HCL 1 G PO TABS: 1000.0000 mg | ORAL_TABLET | Freq: Every day | ORAL | 2 refills | Status: DC

## 2020-08-31 MED ORDER — METRONIDAZOLE 500 MG PO TABS: 500.0000 mg | ORAL_TABLET | Freq: Three times a day (TID) | ORAL | 0 refills | Status: DC

## 2020-08-31 MED ORDER — TRIAMCINOLONE ACETONIDE 0.1 % EX OINT: 1.0000 "application " | TOPICAL_OINTMENT | Freq: Two times a day (BID) | CUTANEOUS | 0 refills | Status: DC

## 2020-08-31 NOTE — Patient Instructions (Signed)
It was a pleasure to see you today!  Thank you for choosing [facility] for your primary care.  Ann [name] was seen for STI check.   Our plans for today were:  Please take the prescribed medication for this flare as soon as you can to get the most effect of the treatment.   Contact dermatitis is what is likely causing the area on your back. I have prescribed a cream for you to apply twice daily.  I will call you if there are any abnormal results from your tests today. Please take ibuprofen or aleeve to help with pain. You could also try dermoplast to spray on your vaginal area to help with pain but stop this if it makes the pain worse.     You should return to our clinic if symptoms worsen once you are able to take your medication.   Best Wishes,   Dr. Neita Garnet

## 2020-08-31 NOTE — Assessment & Plan Note (Signed)
Wet prep + for clue cells, + whiff test Prescribed metronidazole 500 twice daily for 7 days Call patient after visit in order to explain prescription and avoid alcohol use and sexual activity while taking these medications Patient verbalized understanding

## 2020-08-31 NOTE — Progress Notes (Signed)
    SUBJECTIVE:   CHIEF COMPLAINT / HPI: STI check up and skin concern   STI  Patient would like to be checked for HIV and syphilis. She reports intercourse with new partner and would be reassured with screening for STIs as they sometimes do not use condoms. Patient also reports having current HSV flare with burning sensation in vulva region. She reports that she was given a prescription for valtrex from her OB/GYN but is waiting to receive prescription in the mail. She denies any recent concern for exposure.    Skin Concern  Patient has history of eczema and psoriasis. She is concerned that she may have herpetic lesions on her back. She reports that she scratched her back when she first noticed it. Reports that the area is very tender. Patient is concerned that this may be due to current HSV flare. She has not tried any topicals on the area to improve it.   PERTINENT  PMH / PSH:  Eczema  HSV1   OBJECTIVE:   BP 102/70   Pulse 63   Ht 5\' 8"  (1.727 m)   Wt 255 lb (115.7 kg)   LMP 08/28/2020   SpO2 98%   BMI 38.77 kg/m   General: female appearing stated age in no acute distress HEENT: MMM, no oral lesions noted Cardio: Normal S1 and S2, no S3 or S4. Rhythm is regular Pulm: Clear to auscultation bilaterally, no crackles, wheezing, or diminished breath sounds.  Abdomen: Bowel sounds normal. Abdomen soft and non-tender.  GU: copious gray tinted discharge in vaginal vault, no erythema on cervix, no bleeding from external os; healed areas of vulvar lesions   ASSESSMENT/PLAN:   Bacterial vaginitis Wet prep + for clue cells, + whiff test Prescribed metronidazole 500 twice daily for 7 days Call patient after visit in order to explain prescription and avoid alcohol use and sexual activity while taking these medications Patient verbalized understanding  Positive test for herpes simplex virus (HSV) antibody Patient reports active flare with valvular pain and pruritus. Was prescribed  Valtrex by OB/gyne but has not picked up prescription is reviewed Prescribed Valtrex to be picked up today  Contact dermatitis Area on back concerning for contact dermatitis given distribution. Lesions not consistent with herpetic lesions.  - prescribed triamcinolone 0.1% ointment       10/28/2020, MD [facility]

## 2020-08-31 NOTE — Assessment & Plan Note (Signed)
Patient reports active flare with valvular pain and pruritus. Was prescribed Valtrex by OB/gyne but has not picked up prescription is reviewed Prescribed Valtrex to be picked up today

## 2020-09-01 ENCOUNTER — Other Ambulatory Visit: Payer: Self-pay | Admitting: Family Medicine

## 2020-09-01 LAB — CERVICOVAGINAL ANCILLARY ONLY
Chlamydia: POSITIVE — AB
Comment: NEGATIVE
Comment: NORMAL
Neisseria Gonorrhea: NEGATIVE

## 2020-09-01 LAB — RPR: RPR Ser Ql: NONREACTIVE

## 2020-09-01 LAB — HIV ANTIBODY (ROUTINE TESTING W REFLEX): HIV Screen 4th Generation wRfx: NONREACTIVE

## 2020-09-01 MED ORDER — AZITHROMYCIN 250 MG PO TABS: 1000.0000 mg | ORAL_TABLET | Freq: Once | ORAL | 0 refills | Status: AC

## 2020-09-01 NOTE — Progress Notes (Signed)
azithromycin 1g sent to pharmacy. Patient will need test of cure and emphasize partner treatment. Unable to reach patient by phone nor leave voicemail.

## 2020-09-02 ENCOUNTER — Telehealth: Payer: Self-pay | Admitting: Family Medicine

## 2020-09-02 NOTE — Telephone Encounter (Signed)
Patient is returning a missed call from our office. There was no vm. She thinks it is following up from her appointment on 08/31/20.  She would like for someone to call her back at [phone].

## 2020-09-02 NOTE — Telephone Encounter (Signed)
LM for patient to please review message from Dr. Neita Garnet on [place].  Patient tested positive for chlamydia and will need to pick up her medication at the pharmacy.  Petula Rotolo,CMA

## 2020-09-03 DIAGNOSIS — L259 Unspecified contact dermatitis, unspecified cause: Secondary | ICD-10-CM | POA: Insufficient documentation

## 2020-09-03 NOTE — Assessment & Plan Note (Signed)
Area on back concerning for contact dermatitis given distribution. Lesions not consistent with herpetic lesions.  - prescribed triamcinolone 0.1% ointment

## 2020-09-25 NOTE — Progress Notes (Deleted)
° °  Subjective:   Patient ID: Ann [name]    DOB: 03-Aug-1996, 24 y.o. female   MRN: [id number]  Ann [name] is a 24 y.o. female with a history of allergic rhinitis, eczema, psoriasis,   HSV antibody positive, here for STD test.  STD test:  Review of Systems:  Per HPI.   Objective:   LMP 08/28/2020  Vitals and nursing note reviewed.  General: pleasant ***, sitting comfortably in exam chair, well nourished, well developed, in no acute distress with non-toxic appearance HEENT: normocephalic, atraumatic, moist mucous membranes, oropharynx clear without erythema or exudate, TM normal bilaterally  Neck: supple, non-tender without lymphadenopathy CV: regular rate and rhythm without murmurs, rubs, or gallops, no lower extremity edema, 2+ radial and pedal pulses bilaterally Lungs: clear to auscultation bilaterally with normal work of breathing on room air Resp: breathing comfortably on room air, speaking in full sentences Abdomen: soft, non-tender, non-distended, no masses or organomegaly palpable, normoactive bowel sounds Skin: warm, dry, no rashes or lesions Extremities: warm and well perfused, normal tone MSK: ROM grossly intact, strength intact, gait normal Neuro: Alert and oriented, speech normal  Assessment & Plan:   No problem-specific Assessment & Plan notes found for this encounter.  No orders of the defined types were placed in this encounter.  No orders of the defined types were placed in this encounter.   Orpah Cobb, DO PGY-3, [facility] 09/25/2020 9:43 PM

## 2020-09-26 ENCOUNTER — Ambulatory Visit: Payer: Medicaid Other | Admitting: Family Medicine

## 2020-10-17 ENCOUNTER — Encounter: Payer: Self-pay | Admitting: Obstetrics & Gynecology

## 2020-10-17 ENCOUNTER — Other Ambulatory Visit: Payer: Self-pay

## 2020-10-17 ENCOUNTER — Ambulatory Visit (INDEPENDENT_AMBULATORY_CARE_PROVIDER_SITE_OTHER): Payer: Self-pay | Admitting: Obstetrics & Gynecology

## 2020-10-17 ENCOUNTER — Other Ambulatory Visit (HOSPITAL_COMMUNITY)
Admission: RE | Admit: 2020-10-17 | Discharge: 2020-10-17 | Disposition: A | Discharge status: Home / Self Care | Payer: Medicaid Other | Source: Ambulatory Visit | Attending: Obstetrics & Gynecology | Admitting: Obstetrics & Gynecology

## 2020-10-17 VITALS — BP 118/69 | HR 64 | Ht 68.0 in | Wt 244.0 lb

## 2020-10-17 DIAGNOSIS — N898 Other specified noninflammatory disorders of vagina: Secondary | ICD-10-CM | POA: Insufficient documentation

## 2020-10-17 DIAGNOSIS — Z8619 Personal history of other infectious and parasitic diseases: Secondary | ICD-10-CM | POA: Insufficient documentation

## 2020-10-17 MED ORDER — METRONIDAZOLE 500 MG PO TABS
500.0000 mg | ORAL_TABLET | Freq: Two times a day (BID) | ORAL | 0 refills | Status: DC
Start: 2020-10-17 — End: 2021-02-14

## 2020-10-17 MED ORDER — VALACYCLOVIR HCL 500 MG PO TABS: 500.0000 mg | ORAL_TABLET | Freq: Every day | ORAL | 4 refills | Status: DC

## 2020-10-17 NOTE — Progress Notes (Signed)
GYNECOLOGY  VISIT  CC:   Recheck for chlamydia and malodorous discharge  HPI: 24 y.o. G0P0000 Single Black or [place] American female here for repeat testing for chlamydia that was diagnosed in 08/31/2020.  She had a prior test in July that was positive for both GC and Chl then.  Because she is having discharge with odor, she requested an appt for earlier testing.  States she is SA but is using condoms all the time.  She does have a "bump" in her groin that she wants me to assess today.  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  She's had several outbreaks and would like suppressive therapy.  Has done well with valtrex in the past. She is not currently on any other contraception and declines this today.    Last pap was 05/20/2019 and was normal.  GYNECOLOGIC HISTORY: Patient's last menstrual period was 09/25/2020 (exact date). Contraception: condoms only  Patient Active Problem List   Diagnosis Date Noted  . Positive test for herpes simplex virus (HSV) antibody 08/31/2020  . Routine screening for STI (sexually transmitted infection) 06/26/2020  . BRBPR (bright red blood per rectum) 06/26/2020  . Syncopal episodes 12/20/2017  . Obesity (BMI 35.0-39.9 without comorbidity) 05/06/2009  . RHINITIS, ALLERGIC, DUE TO POLLEN 03/17/2007  . 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  . ECZEMA, ATOPIC DERMATITIS 01/23/2007    Past Medical History:  Diagnosis Date  . Possible exposure to STD 03/26/2019    No past surgical history on file.  MEDS:   Current Outpatient Medications on File Prior to Visit  Medication Sig Dispense Refill  . ELDERBERRY PO Take by mouth.    . Multiple Vitamins-Minerals (AIRBORNE PO) Take by mouth.    . Multiple Vitamins-Minerals (WOMENS MULTIVITAMIN PO) Take by mouth.    . valACYclovir (VALTREX) 1000 MG tablet Take 1 tablet (1,000 mg total) by mouth daily. 5 tablet 2   No current facility-administered medications on  file prior to visit.    ALLERGIES: Pollen extract  Family History  Problem Relation Age of Onset  . Diabetes Maternal Uncle   . Cancer Maternal Grandmother   . Hypertension Maternal Grandmother   . Heart disease Neg Hx     SH:  Smoker, single  Review of Systems  Constitutional: Negative.   Gastrointestinal: Negative.   Genitourinary: Positive for vaginal discharge. Negative for dysuria, genital sores, hematuria, vaginal bleeding and vaginal pain.    PHYSICAL EXAMINATION:    BP 118/69   Pulse 64   Ht 5\' 8"  (1.727 m)   Wt 244 lb (110.7 kg)   LMP 09/25/2020 (Exact Date)   BMI 37.10 kg/m     General appearance: alert, cooperative and appears stated age Abdomen: soft, non-tender; bowel sounds normal; no masses,  no organomegaly Lymph:  Mildly enlarged left groin node noted, mildly tender  Pelvic: External genitalia:  no lesions              Urethra:  normal appearing urethra with no masses, tenderness or lesions              Bartholins and Skenes: normal                 Vagina: normal appearing vagina with whitish adherent discharge, no visible lesions              Cervix: no lesions  Bimanual Exam:  Uterus:  normal size, contour, position, consistency, mobility, non-tender              Adnexa: no mass, fullness, tenderness  Chaperone, Marylynn Pearson, RN, was present for exam.  Assessment: Vaginal discharge H/o GC/Chl in July with positive chl again in October H/o HSV outbreaks  Plan: GC/Chl, trich, yeast and BV testing obtained today Pt desired treatment today.  With prior diagnosis of BV, flagyl 500mg  bid x 7 days sent to pharmacy on file. Rx for valtrex 500mg  daily.  #90/4RF to pharmacy.

## 2020-10-18 LAB — CERVICOVAGINAL ANCILLARY ONLY
Bacterial Vaginitis (gardnerella): POSITIVE — AB
Candida Glabrata: NEGATIVE
Candida Vaginitis: POSITIVE — AB
Chlamydia: NEGATIVE
Comment: NEGATIVE
Comment: NEGATIVE
Comment: NEGATIVE
Comment: NEGATIVE
Comment: NEGATIVE
Comment: NORMAL
Neisseria Gonorrhea: NEGATIVE
Trichomonas: NEGATIVE

## 2020-10-19 ENCOUNTER — Other Ambulatory Visit: Payer: Self-pay | Admitting: Obstetrics & Gynecology

## 2020-10-19 DIAGNOSIS — N898 Other specified noninflammatory disorders of vagina: Secondary | ICD-10-CM

## 2020-10-19 MED ORDER — FLUCONAZOLE 150 MG PO TABS: 150.0000 mg | ORAL_TABLET | Freq: Once | ORAL | 0 refills | Status: AC

## 2020-10-19 NOTE — Progress Notes (Signed)
Diflucan for positive candida

## 2020-12-18 NOTE — Progress Notes (Deleted)
    SUBJECTIVE:   CHIEF COMPLAINT / HPI:   Vaginal Discharge: Patient is a 25 y.o. female presenting with vaginal discharge for *** days.  She states the discharge is of *** consistency.  She endorses *** vaginal odor.  She is interested in screening for sexually transmitted infections today.  PERTINENT  PMH / PSH: ***None relevant  OBJECTIVE:   There were no vitals taken for this visit.   General: NAD, pleasant, able to participate in exam Respiratory: Normal effort, no obvious respiratory distress Pelvic: VULVA: normal appearing vulva with no masses, tenderness or lesions, VAGINA: Normal appearing vagina with normal color, no lesions, with {GYN VAGINAL DISCHARGE:21986} discharge present, ***CERVIX: No lesions, {GYN VAGINAL DISCHARGE:21986} discharge present,  ASSESSMENT/PLAN:   No problem-specific Assessment & Plan notes found for this encounter.    Assessment:  25 y.o. female with vaginal discharge for***days, as well as***.  Physical exam significant for*** discharge.  Wet prep performed today shows *** consistent with ***.  Patient is interested in STI screening.   Plan: -Wet prep as above.  Will treat with***. -GC/chlamydia pending -Will check HIV and RPR  Shirlean Mylar, MD [facility]    This note was prepared using Dragon voice recognition software and may include unintentional dictation errors due to the inherent limitations of voice recognition software.

## 2020-12-19 ENCOUNTER — Ambulatory Visit: Payer: Medicaid Other | Admitting: Family Medicine

## 2021-01-03 ENCOUNTER — Other Ambulatory Visit: Payer: Self-pay | Admitting: Family Medicine

## 2021-01-03 DIAGNOSIS — J069 Acute upper respiratory infection, unspecified: Secondary | ICD-10-CM

## 2021-01-11 ENCOUNTER — Ambulatory Visit (INDEPENDENT_AMBULATORY_CARE_PROVIDER_SITE_OTHER): Payer: Self-pay | Admitting: Student in an Organized Health Care Education/Training Program

## 2021-01-11 ENCOUNTER — Other Ambulatory Visit: Payer: Self-pay

## 2021-01-11 ENCOUNTER — Other Ambulatory Visit (HOSPITAL_COMMUNITY)
Admission: RE | Admit: 2021-01-11 | Discharge: 2021-01-11 | Disposition: A | Discharge status: Home / Self Care | Payer: Medicaid Other | Source: Ambulatory Visit | Attending: Family Medicine | Admitting: Family Medicine

## 2021-01-11 VITALS — BP 120/70 | HR 72 | Ht 68.0 in | Wt 249.8 lb

## 2021-01-11 DIAGNOSIS — Z113 Encounter for screening for infections with a predominantly sexual mode of transmission: Secondary | ICD-10-CM | POA: Insufficient documentation

## 2021-01-11 DIAGNOSIS — Z3200 Encounter for pregnancy test, result unknown: Secondary | ICD-10-CM

## 2021-01-11 LAB — POCT URINE PREGNANCY: Preg Test, Ur: NEGATIVE

## 2021-01-11 NOTE — Patient Instructions (Signed)
It was a pleasure to see you today!  To summarize our discussion for this visit:  I recommend you take a prenatal vitamin for any woman who has potential to become pregnant.   I will contact you with the results of your tests via mychart.   Please let me know if you need anything else.  Some additional health maintenance measures we should update are: Health Maintenance Due  Topic Date Due  . COVID-19 Vaccine (1) Never done  .    Please return to our clinic to see me as needed.  Call the clinic at [phone] if your symptoms worsen or you have any concerns.   Thank you for allowing me to take part in your care,  Dr. Jamelle Rushing   Safe Sex Practicing safe sex means taking steps before and during sex to reduce your risk of:  Getting an STI (sexually transmitted infection).  Giving your partner an STI.  Unwanted or unplanned pregnancy. How to practice safe sex Ways you can practice safe sex  Limit your sexual partners to only one partner who is having sex with only you.  Avoid using alcohol and drugs before having sex. Alcohol and drugs can affect your judgment.  Before having sex with a new partner: ? Talk to your partner about past partners, past STIs, and drug use. ? Get screened for STIs and discuss the results with your partner. Ask your partner to get screened too.  Check your body regularly for sores, blisters, rashes, or unusual discharge. If you notice any of these problems, visit your health care provider.  Avoid sexual contact if you have symptoms of an infection or you are being treated for an STI.  While having sex, use a condom. Make sure to: ? Use a condom every time you have vaginal, oral, or anal sex. Both females and males should wear condoms during oral sex. ? Keep condoms in place from the beginning to the end of sexual activity. ? Use a latex condom, if possible. Latex condoms offer the best protection. ? Use only water-based lubricants  with a condom. Using petroleum-based lubricants or oils will weaken the condom and increase the chance that it will break.   Ways your health care provider can help you practice safe sex  See your health care provider for regular screenings, exams, and tests for STIs.  Talk with your health care provider about what kind of birth control (contraception) is best for you.  Get vaccinated against hepatitis B and human papillomavirus (HPV).  If you are at risk of being infected with HIV (human immunodeficiency virus), talk with your health care provider about taking a prescription medicine to prevent HIV infection. You are at risk for HIV if you: ? Are a man who has sex with other men. ? Are sexually active with more than one partner. ? Take drugs by injection. ? Have a sex partner who has HIV. ? Have unprotected sex. ? Have sex with someone who has sex with both men and women. ? Have had an STI.   Follow these instructions at home:  Take over-the-counter and prescription medicines only as told by your health care provider.  Keep all follow-up visits. This is important. Where to find more information  Centers for Disease Control and Prevention: FootballExhibition.com.br  Planned Parenthood: www.plannedparenthood.org  Office on [employer] [facility]: http://hoffman.com/ Summary  Practicing safe sex means taking steps before and during sex to reduce your risk getting an STI, giving your partner an STI,  and having an unwanted or unplanned pregnancy.  Before having sex with a new partner, talk to your partner about past partners, past STIs, and drug use.  Use a condom every time you have vaginal, oral, or anal sex. Both females and males should wear condoms during oral sex.  Check your body regularly for sores, blisters, rashes, or unusual discharge. If you notice any of these problems, visit your health care provider.  See your health care provider for regular screenings, exams, and tests for STIs. This  information is not intended to replace advice given to you by your health care provider. Make sure you discuss any questions you have with your health care provider. Document Revised: 04/18/2020 Document Reviewed: 04/18/2020 Elsevier Patient Education  2021 [employer].

## 2021-01-11 NOTE — Progress Notes (Signed)
    SUBJECTIVE:   CHIEF COMPLAINT / HPI: STD screening  Endorses no vaginal or urinary symptoms. Desires routine STI screening.   She does have unprotected sex and no known contacts with STIs. Denies anal sex but does have oral. Not on birth control and declines to start. Does not desire pregnancy at this time. Took a plan B prior to this appointment.  OBJECTIVE:   BP 120/70   Pulse 72   Ht 5\' 8"  (1.727 m)   Wt 249 lb 12.8 oz (113.3 kg)   LMP 12/20/2020 (Exact Date)   SpO2 98%   BMI 37.98 kg/m   Chaperone present for entirety of sensitive exam General: NAD, pleasant, able to participate in exam Pelvic: external neg for lesions. Vaginal canal positive for mucoid white discharge. Normal nulliparous cervix. Cosmetic shaving with mild folliculitis  Left inguinal lymph node <1cm and tender to palpation, mobile. Extremities: no edema. WWP. Skin: warm and dry, no rashes noted Psych: Normal affect and mood  ASSESSMENT/PLAN:   Routine screening for STI (sexually transmitted infection) Positive h/o HSV1 and chlamydia Denies symptoms today.  Endorses unprotected sex.  - urine pregnancy negative today. S/p plan B recently.  - declines birth control. Recommended prenatal vitamin - provided patient with safe-sex handout - wet prep and GC/chlamydia swab both cervical and pharynx pending.  - HIV/RPR collected today - consider PrEP discussion in the future     12/22/2020, DO [facility]

## 2021-01-11 NOTE — Assessment & Plan Note (Signed)
Positive h/o HSV1 and chlamydia Denies symptoms today.  Endorses unprotected sex.  - urine pregnancy negative today. S/p plan B recently.  - declines birth control. Recommended prenatal vitamin - provided patient with safe-sex handout - wet prep and GC/chlamydia swab both cervical and pharynx pending.  - HIV/RPR collected today - consider PrEP discussion in the future

## 2021-01-12 LAB — CERVICOVAGINAL ANCILLARY ONLY
Chlamydia: NEGATIVE
Chlamydia: POSITIVE — AB
Comment: NEGATIVE
Comment: NEGATIVE
Comment: NORMAL
Comment: NORMAL
Neisseria Gonorrhea: NEGATIVE
Neisseria Gonorrhea: NEGATIVE

## 2021-01-12 LAB — HIV ANTIBODY (ROUTINE TESTING W REFLEX): HIV Screen 4th Generation wRfx: NONREACTIVE

## 2021-01-12 LAB — RPR: RPR Ser Ql: NONREACTIVE

## 2021-01-13 ENCOUNTER — Other Ambulatory Visit: Payer: Self-pay | Admitting: Student in an Organized Health Care Education/Training Program

## 2021-01-13 ENCOUNTER — Telehealth: Payer: Self-pay | Admitting: Family Medicine

## 2021-01-13 MED ORDER — DOXYCYCLINE MONOHYDRATE 100 MG PO TABS: 100.0000 mg | ORAL_TABLET | Freq: Two times a day (BID) | ORAL | 0 refills | Status: AC

## 2021-01-13 NOTE — Telephone Encounter (Signed)
Patient is returning a missed call. I didn't see any documentation on who called. She would like for someone to call her back.   The best call back is [phone]

## 2021-01-16 NOTE — Telephone Encounter (Signed)
Did you attempt to call this patient after your recent appointment with her?

## 2021-01-17 ENCOUNTER — Encounter: Payer: Self-pay | Admitting: Student in an Organized Health Care Education/Training Program

## 2021-01-17 NOTE — Telephone Encounter (Signed)
That would be great, thanks!

## 2021-01-17 NOTE — Telephone Encounter (Signed)
Yes, I saw this patient and left her a hippa compliant voicemail. Then I sent her a mychart message about her test results and the treatment.  I can call her again

## 2021-01-17 NOTE — Progress Notes (Signed)
Left patient a voicemail and mychart message on her lab results and the treatment. She called back with further questions so I called her back to discuss. She is doing well overall.  Answered all of patient's questions regarding treatment and the negative throat swab.  She will return for TOC.  She is no longer with the partner who was her believed source of infection.

## 2021-02-01 ENCOUNTER — Ambulatory Visit: Payer: Medicaid Other

## 2021-02-01 NOTE — Progress Notes (Deleted)
    SUBJECTIVE:   CHIEF COMPLAINT / HPI:   sti check:   Plantar fasciitis:   PERTINENT  PMH / PSH: ***  OBJECTIVE:   There were no vitals taken for this visit.  ***  ASSESSMENT/PLAN:   No problem-specific Assessment & Plan notes found for this encounter.     Sandre Kitty, MD [facility]   {    This will disappear when note is signed, click to select method of visit    :1}

## 2021-02-14 ENCOUNTER — Other Ambulatory Visit: Payer: Self-pay

## 2021-02-14 ENCOUNTER — Encounter: Payer: Self-pay | Admitting: Family Medicine

## 2021-02-14 ENCOUNTER — Ambulatory Visit (INDEPENDENT_AMBULATORY_CARE_PROVIDER_SITE_OTHER): Payer: Self-pay | Admitting: Family Medicine

## 2021-02-14 ENCOUNTER — Other Ambulatory Visit (HOSPITAL_COMMUNITY)
Admission: RE | Admit: 2021-02-14 | Discharge: 2021-02-14 | Disposition: A | Discharge status: Home / Self Care | Payer: Medicaid Other | Source: Ambulatory Visit | Attending: Family Medicine | Admitting: Family Medicine

## 2021-02-14 VITALS — BP 119/70 | HR 100 | Wt 242.8 lb

## 2021-02-14 DIAGNOSIS — Z7251 High risk heterosexual behavior: Secondary | ICD-10-CM | POA: Diagnosis present

## 2021-02-14 DIAGNOSIS — B009 Herpesviral infection, unspecified: Secondary | ICD-10-CM

## 2021-02-14 DIAGNOSIS — Z113 Encounter for screening for infections with a predominantly sexual mode of transmission: Secondary | ICD-10-CM | POA: Diagnosis present

## 2021-02-14 DIAGNOSIS — N898 Other specified noninflammatory disorders of vagina: Secondary | ICD-10-CM

## 2021-02-14 DIAGNOSIS — Z114 Encounter for screening for human immunodeficiency virus [HIV]: Secondary | ICD-10-CM

## 2021-02-14 DIAGNOSIS — N760 Acute vaginitis: Secondary | ICD-10-CM

## 2021-02-14 DIAGNOSIS — B9689 Other specified bacterial agents as the cause of diseases classified elsewhere: Secondary | ICD-10-CM

## 2021-02-14 LAB — POCT WET PREP (WET MOUNT)
Clue Cells Wet Prep Whiff POC: POSITIVE
Trichomonas Wet Prep HPF POC: ABSENT

## 2021-02-14 MED ORDER — VALACYCLOVIR HCL 1 G PO TABS: 1000.0000 mg | ORAL_TABLET | Freq: Every day | ORAL | 0 refills | Status: AC

## 2021-02-14 MED ORDER — METRONIDAZOLE 500 MG PO TABS: 500.0000 mg | ORAL_TABLET | Freq: Two times a day (BID) | ORAL | 0 refills | Status: AC

## 2021-02-14 NOTE — Progress Notes (Signed)
    SUBJECTIVE:   CHIEF COMPLAINT / HPI:   Vaginal Discharge Discharge started 1.5 weeks ago Discharge appears white She endorses vaginal odors She denies vaginal pruritis, abnormal vaginal bleeding, dysuria, hematuria, pelvic pain, nausea, vomiting, fevers    Has history of STIs She is sexually active and occasioanlly use condoms.   Contraception: condoms, was on pills and nexplanon and didn't like.   Patient's last menstrual period was 01/18/2021.  She does not douche. Last Pap: 04/30/2019, negative Desires HIV/RPR: yes Prior Hep C Ab: yes Throat or anal swab for G/C: yes, both She is requesting a refill on valtrex   PERTINENT  PMH / PSH: Allergic rhinitis, atopic dermatitis, psoriasis of the scalp, obesity, history of HSV  OBJECTIVE:   BP 119/70   Pulse 100   Wt 242 lb 12.8 oz (110.1 kg)   LMP 01/18/2021   SpO2 96%   BMI 36.92 kg/m    Physical Exam: General: In NAD Respiratory: Breathing comfortably on room air GU: Pelvic exam performed with patient supine.  Chaperone in room.  Bilateral labia without abnormalities.  Cervix exhibits white, thin discharge, no cervix abnormalities.  No vaginal lesions.  Vaginal discharge white, thin.  Results for orders placed or performed in visit on 02/14/21 (from the past 24 hour(s))  POCT Wet Prep Mellody Drown [place])     Status: Abnormal   Collection Time: 02/14/21  2:20 PM  Result Value Ref Range   Source Wet Prep POC VAG    WBC, Wet Prep HPF POC 0-3    Bacteria Wet Prep HPF POC Moderate (A) Few   Clue Cells Wet Prep HPF POC Moderate (A) None   Clue Cells Wet Prep Whiff POC Positive Whiff    Yeast Wet Prep HPF POC None None   KOH Wet Prep POC None None   Trichomonas Wet Prep HPF POC Absent Absent     ASSESSMENT/PLAN:   Vaginal discharge Wet prep c/w bacterial vaginosis.  Will treat with Flagyl 500 mg twice daily for 7 days.  Also recommend vaginal probiotic.  Return to care if no improvement.  G/C performed.  Pap UTD and normal  previously.    Screen for sexually transmitted diseases G/C performed oral, vaginal and anal.  HIV and RPR obtained.  Trichomonas negative on wet prep.  F/U results.  No known exposure or obvious concern for STD, therefore will hold off on treatment.  HSV-1 (herpes simplex virus 1) infection Refill provided for valtrex 1g x 5days at patient request due to cost.  No lesions noted today on exam.     Unknown Jim, DO [facility]

## 2021-02-14 NOTE — Patient Instructions (Addendum)
Thank you for coming to see me today. It was a pleasure. Today we talked about:   Your wet prep showed bacterial vaginosis.  Please take Flagyl 500 mg twice daily for the next 7 days.  If you do not have improvement in your symptoms, please come back.  Eat a daily yogurt or take a vaginal probiotic.  We performed STD testing today. This will take a few days to come back. If your MyChart is activated, we will message you on there if everything is normal, otherwise we will call. If we need to treat something we will also call you. If you do not hear from [place] in the next 4 days, please give [place] a call.  Please use condoms in the meantime to prevent pregnancy, but below are some options for contraception.  Please follow-up with PCP to discuss your other medical concerns.  If you have any questions or concerns, please do not hesitate to call the office at [phone].  Best,   Luis Abed, DO   To prevent recurrent bacterial vaginosis, I recommend starting a women's probiotic.  You can find these at [employer], [employer], the pharmacy, and [employer].  They are usually in pink bottles.  There are some examples below.  Taking these daily will not completely prevent bacterial vaginosis, but can greatly reduce the recurrence.     Contraception Choices Contraception refers to things you do or use to prevent pregnancy. It is also called birth control. There are several methods of birth control. Talk to your doctor about the best method for you. Hormonal birth control This kind of birth control uses hormones. Here are some types of hormonal birth control:  A tube that is put under the skin of your arm (implant). The tube can stay in for up to 3 years.  Shots you get every 3 months.  Pills you take every day.  A patch you change 1 time each week for 3 weeks. After that, the patch is taken off for 1 week.  A ring you put in the vagina. The ring is left in for 3 weeks. Then it is taken out of the  vagina for 1 week. Then a new ring is put in.  Pills you take after unprotected sex. These are called emergency birth control pills.   Barrier birth control Here are some types of barrier birth control:  A thin covering that is put on the penis before sex (female condom). The covering is thrown away after sex.  A soft, loose covering that is put in the vagina before sex (female condom). The covering is thrown away after sex.  A rubber bowl that sits over the cervix (diaphragm). The bowl must be made for you. The bowl is put into the vagina before sex. The bowl is left in for 6-8 hours after sex. It is taken out within 24 hours.  A small, soft cup that fits over the cervix (cervical cap). The cup must be made for you. The cup should be left in for 6-8 hours after sex. It is taken out within 48 hours.  A sponge that is put into the vagina before sex. It must be left in for at least 6 hours after sex. It must be taken out within 30 hours and thrown away.  A chemical that kills or stops sperm from getting into the womb (uterus). This chemical is called a spermicide. It may be a pill, cream, jelly, or foam to put in the vagina. The  chemical should be used at least 10-15 minutes before sex.   IUD birth control IUD means "intrauterine device." It is put inside the womb. There are two kinds:  Hormone IUD. This kind can stay in the womb for 3-5 years.  Copper IUD. This kind can stay in the womb for 10 years. Permanent birth control Here are some types of permanent birth control:  Surgery to block the fallopian tubes.  Having an insert put into each fallopian tube. This method takes 3 months to work. Other forms of birth control must be used for 3 months.  Surgery to tie off the tubes that carry sperm in men (vasectomy). This method takes 3 months to work. Other forms of birth control must be used for 3 months. Natural planning birth control Here are some types of natural planning birth  control:  Not having sex on the days the woman could get pregnant.  Using a calendar: ? To keep track of the length of each menstrual cycle. ? To find out what days pregnancy can happen. ? To plan to not have sex on days when pregnancy can happen.  Watching for signs of ovulation and not having sex during this time. One way the woman can check for ovulation is to check her temperature.  Waiting to have sex until after ovulation. Where to find more information  Centers for Disease Control and Prevention: FootballExhibition.com.br Summary  Contraception, also called birth control, refers to things you do or use to prevent pregnancy.  Hormonal methods of birth control include implants, injections, pills, patches, vaginal rings, and emergency birth control pills.  Barrier methods of birth control can include female condoms, female condoms, diaphragms, cervical caps, sponges, and spermicides.  There are two types of IUD (intrauterine device) birth control. An IUD can be put in a woman's womb to prevent pregnancy for several years.  Permanent birth control can be done through a procedure for males, females, or both. Natural planning means not having sex when the woman could get pregnant. This information is not intended to replace advice given to you by your health care provider. Make sure you discuss any questions you have with your health care provider. Document Revised: 04/18/2020 Document Reviewed: 04/18/2020 Elsevier Patient Education  2021 [employer].

## 2021-02-14 NOTE — Assessment & Plan Note (Signed)
Refill provided for valtrex 1g x 5days at patient request due to cost.  No lesions noted today on exam.

## 2021-02-14 NOTE — Assessment & Plan Note (Signed)
G/C performed oral, vaginal and anal.  HIV and RPR obtained.  Trichomonas negative on wet prep.  F/U results.  No known exposure or obvious concern for STD, therefore will hold off on treatment.

## 2021-02-14 NOTE — Assessment & Plan Note (Addendum)
Wet prep c/w bacterial vaginosis.  Will treat with Flagyl 500 mg twice daily for 7 days.  Also recommend vaginal probiotic.  Return to care if no improvement.  G/C performed.  Pap UTD and normal previously.

## 2021-02-15 LAB — CERVICOVAGINAL ANCILLARY ONLY
Chlamydia: NEGATIVE
Chlamydia: NEGATIVE
Chlamydia: NEGATIVE
Comment: NEGATIVE
Comment: NEGATIVE
Comment: NEGATIVE
Comment: NORMAL
Comment: NORMAL
Comment: NORMAL
Neisseria Gonorrhea: NEGATIVE
Neisseria Gonorrhea: NEGATIVE
Neisseria Gonorrhea: NEGATIVE

## 2021-02-15 LAB — RPR: RPR Ser Ql: NONREACTIVE

## 2021-02-15 LAB — HIV ANTIBODY (ROUTINE TESTING W REFLEX): HIV Screen 4th Generation wRfx: NONREACTIVE

## 2021-03-06 ENCOUNTER — Other Ambulatory Visit: Payer: Self-pay

## 2021-03-06 ENCOUNTER — Ambulatory Visit (INDEPENDENT_AMBULATORY_CARE_PROVIDER_SITE_OTHER): Payer: Self-pay

## 2021-03-06 DIAGNOSIS — Z111 Encounter for screening for respiratory tuberculosis: Secondary | ICD-10-CM

## 2021-03-06 NOTE — Progress Notes (Signed)
Patient is here for a PPD placement.  PPD placed in RIGHT forearm @ 1040 am.  Patient will return 03/08/2021 to have PPD read. Veronda Prude, RN

## 2021-03-08 ENCOUNTER — Other Ambulatory Visit: Payer: Self-pay

## 2021-03-08 ENCOUNTER — Ambulatory Visit (INDEPENDENT_AMBULATORY_CARE_PROVIDER_SITE_OTHER): Payer: Self-pay

## 2021-03-08 ENCOUNTER — Telehealth: Payer: Self-pay

## 2021-03-08 DIAGNOSIS — Z111 Encounter for screening for respiratory tuberculosis: Secondary | ICD-10-CM

## 2021-03-08 LAB — TB SKIN TEST
Induration: 0 mm
TB Skin Test: NEGATIVE

## 2021-03-08 MED ORDER — CETIRIZINE HCL 10 MG PO TABS: 10.0000 mg | ORAL_TABLET | Freq: Every day | ORAL | 11 refills | Status: DC

## 2021-03-08 NOTE — Progress Notes (Signed)
Patient is here for a PPD read.  It was placed on 03/08/2021 in the right forearm @ 1040 am.    PPD RESULTS:  Result: negative Induration: 0 mm  Letter created and given to patient for documentation purposes.   Veronda Prude, RN

## 2021-03-08 NOTE — Telephone Encounter (Signed)
Patient presents to clinic for PPD read. At time of visit, patient is requesting refill on cetirizine. This is not on current medication list.   If appropriate, please send to [facility] on [address].   Veronda Prude, RN

## 2021-03-15 ENCOUNTER — Ambulatory Visit: Payer: Medicaid Other | Admitting: Family Medicine

## 2021-04-19 NOTE — Progress Notes (Signed)
    SUBJECTIVE:   CHIEF COMPLAINT / HPI:   STD Testing  Patient presenting for STD testing today.  She is currently sexually active.  She does have history of STDs.  She is not using anything for contraception at this time.  She reports that she has used Plan B twice this month.  She is interested in starting birth control pills.  Upper back pain  Patient reports right upper back pain for the last few days.  She denies any trauma but does report sleeping in a certain position which may have triggered this.  She was also concern for possible UTI as she knows this can cause back pain.  She is not having any dysuria, fevers, chills, nausea, vomiting.   PERTINENT  PMH / PSH: HSV, eczema  OBJECTIVE:  BP 110/62   Pulse 67   Ht 5\' 8"  (1.727 m)   Wt 245 lb 3.2 oz (111.2 kg)   LMP 04/16/2021 (Exact Date)   SpO2 99%   BMI 37.28 kg/m   General: well appearing, NAD GU: External vulva and vagina nonerythematous, without any obvious lesions or rash.  No abnormal discharge appreciated.  Normal ruggae of vaginal walls.  Cervix is non erythematous and non-friable.  There is no cervical motion tenderness, masses or gross abnormalities appreciated during bimanual exam.   ASSESSMENT/PLAN:   Upper back pain on right side This is likely MSK in etiology.  It is very unlikely that she would have pain in her upper back with UTI or [place] though UA was done and was negative.  She is requesting prescription for naproxen as this is helped with pain in the past.    Screen for sexually transmitted diseases Obtained GC/C, wet prep, HIV, RPR. Follow up with results.    Contraception management Patient has tried Nexplanon in the past with unwanted side effects.  She would like to restart Sprintec today.  Her urinary pregnancy test was negative.    04/18/2021, MD [facility]

## 2021-04-20 ENCOUNTER — Other Ambulatory Visit: Payer: Self-pay

## 2021-04-20 ENCOUNTER — Other Ambulatory Visit (HOSPITAL_COMMUNITY)
Admission: RE | Admit: 2021-04-20 | Discharge: 2021-04-20 | Disposition: A | Discharge status: Home / Self Care | Payer: Medicaid Other | Source: Ambulatory Visit | Attending: Family Medicine | Admitting: Family Medicine

## 2021-04-20 ENCOUNTER — Ambulatory Visit (INDEPENDENT_AMBULATORY_CARE_PROVIDER_SITE_OTHER): Payer: Self-pay | Admitting: Family Medicine

## 2021-04-20 VITALS — BP 110/62 | HR 67 | Ht 68.0 in | Wt 245.2 lb

## 2021-04-20 DIAGNOSIS — Z113 Encounter for screening for infections with a predominantly sexual mode of transmission: Secondary | ICD-10-CM | POA: Insufficient documentation

## 2021-04-20 DIAGNOSIS — M549 Dorsalgia, unspecified: Secondary | ICD-10-CM

## 2021-04-20 DIAGNOSIS — Z309 Encounter for contraceptive management, unspecified: Secondary | ICD-10-CM

## 2021-04-20 DIAGNOSIS — Z30011 Encounter for initial prescription of contraceptive pills: Secondary | ICD-10-CM

## 2021-04-20 LAB — POCT WET PREP (WET MOUNT)
Clue Cells Wet Prep Whiff POC: POSITIVE
Trichomonas Wet Prep HPF POC: ABSENT

## 2021-04-20 LAB — POCT URINALYSIS DIP (MANUAL ENTRY)
Bilirubin, UA: NEGATIVE
Glucose, UA: NEGATIVE mg/dL
Ketones, POC UA: NEGATIVE mg/dL
Leukocytes, UA: NEGATIVE
Nitrite, UA: NEGATIVE
Spec Grav, UA: 1.025 (ref 1.010–1.025)
Urobilinogen, UA: 0.2 E.U./dL
pH, UA: 6 (ref 5.0–8.0)

## 2021-04-20 LAB — POCT UA - MICROSCOPIC ONLY

## 2021-04-20 LAB — POCT URINE PREGNANCY: Preg Test, Ur: NEGATIVE

## 2021-04-20 MED ORDER — METRONIDAZOLE 500 MG PO TABS: 500.0000 mg | ORAL_TABLET | Freq: Two times a day (BID) | ORAL | 0 refills | Status: AC

## 2021-04-20 MED ORDER — NAPROXEN 500 MG PO TABS: 500.0000 mg | ORAL_TABLET | Freq: Two times a day (BID) | ORAL | 0 refills | Status: DC

## 2021-04-20 MED ORDER — NORGESTIMATE-ETH ESTRADIOL 0.25-35 MG-MCG PO TABS: 1.0000 | ORAL_TABLET | Freq: Every day | ORAL | 11 refills | Status: DC

## 2021-04-20 NOTE — Assessment & Plan Note (Signed)
This is likely MSK in etiology.  It is very unlikely that she would have pain in her upper back with UTI or Pilo though UA was done and was negative.  She is requesting prescription for naproxen as this is helped with pain in the past.

## 2021-04-20 NOTE — Assessment & Plan Note (Signed)
Obtained GC/C, wet prep, HIV, RPR. Follow up with results.  

## 2021-04-20 NOTE — Patient Instructions (Signed)
I have sent birth control pills to the pharmacy We will follow-up with you on your test results for today I have also sent naproxen to the pharmacy for your back pain

## 2021-04-20 NOTE — Assessment & Plan Note (Addendum)
Patient has tried Nexplanon in the past with unwanted side effects.  She would like to restart Sprintec today.  Her urinary pregnancy test was negative.

## 2021-04-21 LAB — CERVICOVAGINAL ANCILLARY ONLY
Chlamydia: NEGATIVE
Comment: NEGATIVE
Comment: NORMAL
Neisseria Gonorrhea: NEGATIVE

## 2021-04-21 LAB — HIV ANTIBODY (ROUTINE TESTING W REFLEX): HIV Screen 4th Generation wRfx: NONREACTIVE

## 2021-04-21 LAB — RPR: RPR Ser Ql: NONREACTIVE

## 2021-04-28 ENCOUNTER — Other Ambulatory Visit: Payer: Self-pay

## 2021-04-28 ENCOUNTER — Encounter: Payer: Self-pay | Admitting: Family Medicine

## 2021-04-28 ENCOUNTER — Ambulatory Visit (INDEPENDENT_AMBULATORY_CARE_PROVIDER_SITE_OTHER): Payer: Self-pay | Admitting: Family Medicine

## 2021-04-28 VITALS — BP 110/72 | HR 83 | Wt 243.6 lb

## 2021-04-28 DIAGNOSIS — E669 Obesity, unspecified: Secondary | ICD-10-CM

## 2021-04-28 DIAGNOSIS — L0291 Cutaneous abscess, unspecified: Secondary | ICD-10-CM

## 2021-04-28 DIAGNOSIS — R7303 Prediabetes: Secondary | ICD-10-CM

## 2021-04-28 LAB — POCT GLYCOSYLATED HEMOGLOBIN (HGB A1C): Hemoglobin A1C: 5.9 % — AB (ref 4.0–5.6)

## 2021-04-28 LAB — POCT URINE PREGNANCY: Preg Test, Ur: NEGATIVE

## 2021-04-28 MED ORDER — CHLORHEXIDINE GLUCONATE 4 % EX LIQD: Freq: Every day | CUTANEOUS | 0 refills | Status: DC | PRN

## 2021-04-28 NOTE — Assessment & Plan Note (Signed)
Prediabetes, discussed dietary and exercise. Referral to PREP program.

## 2021-04-28 NOTE — Patient Instructions (Addendum)
It was wonderful to see you today.  Please bring ALL of your medications with you to every visit.   Today we talked about:  --Using Hibaclens   -- Use ONLY dove cleanser  -- I do not recommend hair remover  -- Use Bactroban twice a day for two days  - Reduce your alcohol use  --Follow up with your PCP in 3 months for a check in    Thank you for choosing [facility].   Please call [phone] with any questions about today's appointment.  Please be sure to schedule follow up at the front  desk before you leave today.   Terisa Starr, MD  Family Medicine

## 2021-04-28 NOTE — Progress Notes (Signed)
    SUBJECTIVE:   CHIEF COMPLAINT / HPI:  Ann [name] is a pleasant 25 year old with history of obesity, recurrent abscesses, and STI presenting with a L sided lesion in the inguinal area . This started on 5/29 as a painful 'boil'. This then began to drain after application of a topical agent. She uses only gentle cleanser. She tries to not shave and avoids hair removal treatments to the best of her ability. Has a history of abscesses in groin and axilla. Denies history of diabetes but is interested in maintaining health.   Has one partner (three in last year), just had period. No interested in becoming pregnant, has not yet picked up COC. She will pick up today. She is planning to start metronidazole therapy. She reports drinking ~ 8 ounces of wine or more each night. 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   PERTINENT  PMH / PSH/Family/Social History : Updated  OBJECTIVE:   BP 110/72   Pulse 83   Wt 243 lb 9.6 oz (110.5 kg)   LMP 04/16/2021 (Exact Date)   SpO2 98%   BMI 37.04 kg/m   Today's weight:  Last Weight  Most recent update: 04/28/2021 11:12 AM   Weight  110.5 kg (243 lb 9.6 oz)           Review of prior weights: Filed Weights   04/28/21 1112  Weight: 243 lb 9.6 oz (110.5 kg)    Cardiac: Warm well perfused.  Capillary refill less than 3 seconds Respiratory breathing comfortably on room air Psych: Pleasant normal affect, appropriate, normal rate of speech Chaperoned exam (Shelly) 1X2 inch draining ulcer in left inguinal area (in intertriginous area)  with small amount of drainage, healing, no LAD Pregnancy test negative  ASSESSMENT/PLAN:   Obesity (BMI 35.0-39.9 without comorbidity) Prediabetes, discussed dietary and exercise. Referral to PREP program.    Abscess, in healing stages, suspect may be due to underlying hydradenitis but could be due to shaving. Not consistent with HSV ulcer. Painful so unlikely to be Treponemal. Also considered LGV.  - Hibiclens twice per week  - No  indication for I and D as drainage - No surrounding cellulitis and no indication for oral ABX - Topical bactroban  - Discussed reducing shaving, using unscented soaps     Terisa Starr, MD  Family Medicine Teaching Service  [facility]

## 2021-06-08 ENCOUNTER — Ambulatory Visit: Payer: Medicaid Other | Admitting: Family Medicine

## 2021-06-23 ENCOUNTER — Other Ambulatory Visit (HOSPITAL_COMMUNITY)
Admission: RE | Admit: 2021-06-23 | Discharge: 2021-06-23 | Disposition: A | Discharge status: Home / Self Care | Payer: Medicaid Other | Source: Ambulatory Visit | Attending: Family Medicine | Admitting: Family Medicine

## 2021-06-23 ENCOUNTER — Ambulatory Visit (INDEPENDENT_AMBULATORY_CARE_PROVIDER_SITE_OTHER): Payer: Self-pay | Admitting: Family Medicine

## 2021-06-23 ENCOUNTER — Other Ambulatory Visit: Payer: Self-pay

## 2021-06-23 VITALS — BP 113/71 | HR 71 | Ht 68.0 in | Wt 252.2 lb

## 2021-06-23 DIAGNOSIS — N898 Other specified noninflammatory disorders of vagina: Secondary | ICD-10-CM | POA: Insufficient documentation

## 2021-06-23 DIAGNOSIS — Z113 Encounter for screening for infections with a predominantly sexual mode of transmission: Secondary | ICD-10-CM

## 2021-06-23 DIAGNOSIS — L02429 Furuncle of limb, unspecified: Secondary | ICD-10-CM | POA: Insufficient documentation

## 2021-06-23 DIAGNOSIS — A599 Trichomoniasis, unspecified: Secondary | ICD-10-CM

## 2021-06-23 DIAGNOSIS — L02422 Furuncle of left axilla: Secondary | ICD-10-CM

## 2021-06-23 LAB — POCT WET PREP (WET MOUNT): Clue Cells Wet Prep Whiff POC: POSITIVE

## 2021-06-23 MED ORDER — VALACYCLOVIR HCL 1 G PO TABS: 1000.0000 mg | ORAL_TABLET | Freq: Every day | ORAL | 5 refills | Status: AC

## 2021-06-23 MED ORDER — METRONIDAZOLE 500 MG PO TABS: 500.0000 mg | ORAL_TABLET | Freq: Two times a day (BID) | ORAL | 0 refills | Status: AC

## 2021-06-23 MED ORDER — NORELGESTROMIN-ETH ESTRADIOL 150-35 MCG/24HR TD PTWK: 1.0000 | MEDICATED_PATCH | TRANSDERMAL | 12 refills | Status: DC

## 2021-06-23 NOTE — Progress Notes (Signed)
    SUBJECTIVE:   CHIEF COMPLAINT / HPI:   Vaginal odor Patient is a 25 y.o. female presenting with concerns for sexually transmitted infections and vaginal odor.  Denies vaginal discharge.  Notes 2 recent partners, last unprotected sexual intercourse was 7/18.  She had a normal period on 7/19.  She notes history of BV.  She is interested in screening for sexually transmitted infections today.  Not currently taking any prenatal vitamins.  Not currently on birth control, states she was prescribed pills but did not start them. Interested in birth control patch.   PERTINENT  PMH / PSH: None relevant  OBJECTIVE:   BP 113/71   Pulse 71   Ht 5\' 8"  (1.727 m)   Wt 252 lb 3.2 oz (114.4 kg)   LMP 06/13/2021   SpO2 100%   BMI 38.35 kg/m    General: NAD, pleasant, able to participate in exam Respiratory: Normal effort, no obvious respiratory distress Skin: Warm and dry. There is a 4cm boil that is not erythematous or draining under her left axilla. Mild tenderness to palpation. Pelvic: VULVA: normal appearing vulva with no masses, tenderness or lesions, VAGINA: Normal appearing vagina with normal color, no lesions, with scant and white discharge present. CERVIX: No lesions, non-friable, no discharge appreciated from cervical os.  Chaperone 06/15/2021, CMA present for pelvic exam     ASSESSMENT/PLAN:   Screen for STD (sexually transmitted disease) Assessment:  25 y.o. female presents with concerns of possible sexually transmitted infections. She has two partners and has had unprotected intercourse with one. Wet prep and STI testing performed today (HIV, RPR, Hepatitis B, GC/Chlamydia, Trich). Physical exam significant for scant white discharge. Wet prep is positive for trichomonas and bacterial vaginosis.  Plan: -Metronidazole 500 mg BID x 7 days. Advised to abstain from sexual intercourse until she and partners are fully treated. -GC/chlamydia pending -Will check HIV, RPR and  Hepatitis B -Advised to abstain from sexual intercourse until she and her partners are treated -Started on Xulane patches for birth control -Advised to start taking prenatal vitamins  Boil, axilla Reports recurrent boils under axilla, occasionally on groin.  Currently has a left axillary boil.  Scheduled for visit in dermatology clinic on 9/1.   11/1, DO [facility]

## 2021-06-23 NOTE — Patient Instructions (Signed)
It was wonderful to see you today.  Please bring ALL of your medications with you to every visit.   Today we talked about:  -We are testing to check for sexually transmitted infections, bacterial vaginosis, and yeast infections.  I will let you know the results via MyChart and send prescriptions if appropriate. -I sent a prescription for valacyclovir.  Take 1 tablet daily for 5 days at the sign of an outbreak. -I have sent birth control patches.  Change the patch weekly. -You have a dermatology appointment on 9/1 for your boil.  Thank you for choosing [facility].   Please call [phone] with any questions about today's appointment.  Please be sure to schedule follow up at the front  desk before you leave today.   Sabino Dick, DO PGY-2 Family Medicine

## 2021-06-23 NOTE — Assessment & Plan Note (Signed)
Reports recurrent boils under axilla, occasionally on groin.  Currently has a left axillary boil.  Scheduled for visit in dermatology clinic on 9/1.

## 2021-06-23 NOTE — Assessment & Plan Note (Addendum)
Assessment:  25 y.o. female presents with concerns of possible sexually transmitted infections. She has two partners and has had unprotected intercourse with one. Wet prep and STI testing performed today (HIV, RPR, Hepatitis B, GC/Chlamydia, Trich). Physical exam significant for scant white discharge. Wet prep is positive for trichomonas and bacterial vaginosis.  Plan: -Metronidazole 500 mg BID x 7 days. Advised to abstain from sexual intercourse until she and partners are fully treated. -GC/chlamydia pending -Will check HIV, RPR and Hepatitis B -Advised to abstain from sexual intercourse until she and her partners are treated -Started on Xulane patches for birth control -Advised to start taking prenatal vitamins

## 2021-06-24 LAB — RPR: RPR Ser Ql: NONREACTIVE

## 2021-06-24 LAB — HIV ANTIBODY (ROUTINE TESTING W REFLEX): HIV Screen 4th Generation wRfx: NONREACTIVE

## 2021-06-24 LAB — HEPATITIS B SURFACE ANTIGEN: Hepatitis B Surface Ag: NEGATIVE

## 2021-06-26 LAB — CERVICOVAGINAL ANCILLARY ONLY
Chlamydia: NEGATIVE
Comment: NEGATIVE
Comment: NEGATIVE
Comment: NORMAL
Neisseria Gonorrhea: NEGATIVE
Trichomonas: POSITIVE — AB

## 2021-07-10 ENCOUNTER — Ambulatory Visit: Payer: Medicaid Other

## 2021-07-12 NOTE — Progress Notes (Signed)
    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 She has tried epsom salts, sitz baths, naproxen and nothing is helping. She also took her sisters clindamycin 300 mg daily for 3 days, last day was Sunday.  Started having watery diar/rhea and threw up several times Sunday night (08/14). Last threw up Monday morning (08/15). She is still having the watery diarrhea, 5 times per day but states it seems to be getting better. Slightly decreased appetite but still able to eat. Denies fever and chills  History of Trichomonas and BV Was seen on 06/23/21 for white vaginal discharge, was positive for trichomonas and BV.  Started on metronidazole. She was also prescribed Xulane patches for birth control during this visit which she decided not to take because they were very expensive.  She is not interested in starting birth control at this visit.  Abscess of L axilla Patient had this abscess on the left axilla at her last visit on 06/23/2021.  She was also seen on 04/28/21 for an abscess in the inguinal area.  She has a significant history of getting abscesses/boils in axillas and groin area.  She was advised to use Hibiclens on these areas but it was too expensive.  She was given topical Bactroban to use, states she has used this on occasion, about once a week.  OBJECTIVE:    General: NAD, pleasant, able to participate in exam Cardiac: RRR, no murmurs. Respiratory: CTAB, normal effort, No wheezes, rales or rhonchi Abdomen: Bowel sounds present, nontender, nondistended, no hepatosplenomegaly. Extremities: no edema or cyanosis. Skin: warm and dry, no rashes noted Neuro: alert, no obvious focal deficits Psych: Normal affect and mood  ASSESSMENT/PLAN:   Bartholin cyst -[facility] for treatment -Advised to use Tylenol, naproxen, ice packs, and sitz bath's for pain relief  Trichomonas Patient was tested for BV, trichomonas, GC and chlamydia today. She tested positive for trichomonas.  GC and Chlamydia are still pending.  It is possible she cleared the initial trichomonas infection diagnosed on 7/29 but got it from her partner again. -Metronidazole 500 mg twice daily for 7 days  Abscess left axilla Hidradenitis suppurativa is high on the differential list.  Patient has a dermatology appointment in September. -Topical clindamycin solution twice daily when abscesses flare -Patient should be counseled on smoking cessation, obesity, and prediabetes at a later visit in an effort to reduce these flareups     Dr. Erick Alley, DO [facility]

## 2021-07-13 ENCOUNTER — Ambulatory Visit (INDEPENDENT_AMBULATORY_CARE_PROVIDER_SITE_OTHER): Payer: Self-pay | Admitting: Student

## 2021-07-13 ENCOUNTER — Encounter: Payer: Self-pay | Admitting: Student

## 2021-07-13 ENCOUNTER — Telehealth: Payer: Self-pay | Admitting: Student

## 2021-07-13 ENCOUNTER — Other Ambulatory Visit (HOSPITAL_COMMUNITY)
Admission: RE | Admit: 2021-07-13 | Discharge: 2021-07-13 | Disposition: A | Discharge status: Home / Self Care | Payer: Medicaid Other | Source: Ambulatory Visit | Attending: Family Medicine | Admitting: Family Medicine

## 2021-07-13 ENCOUNTER — Other Ambulatory Visit: Payer: Self-pay

## 2021-07-13 VITALS — BP 106/52 | HR 73 | Ht 68.0 in | Wt 252.4 lb

## 2021-07-13 DIAGNOSIS — Z113 Encounter for screening for infections with a predominantly sexual mode of transmission: Secondary | ICD-10-CM | POA: Insufficient documentation

## 2021-07-13 DIAGNOSIS — N750 Cyst of Bartholin's gland: Secondary | ICD-10-CM

## 2021-07-13 DIAGNOSIS — L0291 Cutaneous abscess, unspecified: Secondary | ICD-10-CM

## 2021-07-13 DIAGNOSIS — A599 Trichomoniasis, unspecified: Secondary | ICD-10-CM

## 2021-07-13 LAB — POCT WET PREP (WET MOUNT): Clue Cells Wet Prep Whiff POC: NEGATIVE

## 2021-07-13 MED ORDER — CLINDAMYCIN PHOSPHATE 1 % EX SOLN: Freq: Two times a day (BID) | CUTANEOUS | 0 refills | Status: DC

## 2021-07-13 MED ORDER — METRONIDAZOLE 500 MG PO TABS: 500.0000 mg | ORAL_TABLET | Freq: Two times a day (BID) | ORAL | 0 refills | Status: DC

## 2021-07-13 NOTE — Patient Instructions (Addendum)
It was great to see you! Thank you for allowing me to participate in your care!  I recommend that you always bring your medications to each appointment as this makes it easy to ensure you are on the correct medications and helps [place] not miss when refills are needed.  Our plans for today:  - Please call [facility] at [phone] to schedule an urgent appointment for treatment of the Bartholin cyst -For pain you can take Tylenol as needed, naproxen as needed, apply ice packs, and use sits baths -I have prescribed to you an antibiotic solution called clindamycin which you can apply topically 2 times a day when you have an abscess flareup  We are checking some labs today, I will call you if they are abnormal will send you a MyChart message or a letter if they are normal.  If you do not hear about your labs in the next 2 weeks please let us know.  Take care and seek immediate care sooner if you develop any concerns.   Dr. Erick Alley, DO [facility]

## 2021-07-13 NOTE — Telephone Encounter (Signed)
Attempted to call patient with positive results for trichomonas.  Left a voicemail asking her to call back.  Also sent patient a MyChart message with results and instructions to pick up metronidazole that I sent into her pharmacy.

## 2021-07-14 LAB — CERVICOVAGINAL ANCILLARY ONLY
Comment: NEGATIVE
Trichomonas: POSITIVE — AB

## 2021-07-27 ENCOUNTER — Ambulatory Visit (INDEPENDENT_AMBULATORY_CARE_PROVIDER_SITE_OTHER): Payer: Self-pay | Admitting: Family Medicine

## 2021-07-27 ENCOUNTER — Other Ambulatory Visit: Payer: Self-pay

## 2021-07-27 DIAGNOSIS — L732 Hidradenitis suppurativa: Secondary | ICD-10-CM

## 2021-07-27 DIAGNOSIS — A5901 Trichomonal vulvovaginitis: Secondary | ICD-10-CM

## 2021-07-27 HISTORY — DX: Trichomonal vulvovaginitis: A59.01

## 2021-07-27 MED ORDER — DOXYCYCLINE HYCLATE 100 MG PO TABS: 100.0000 mg | ORAL_TABLET | Freq: Two times a day (BID) | ORAL | 0 refills | Status: AC

## 2021-07-27 NOTE — Progress Notes (Signed)
    SUBJECTIVE:   CHIEF COMPLAINT / HPI:   Boil: C/O had a non-healing boil on her left axilla for > 1.5 months ago. It irritates, leaks, and sometimes hurts her. She used multiple topical solutions, including topical antibiotics, with no improvement. She had a similar boil in her groin a while back, which had healed entirely. She showed me a picture of her groin boil on her phone, and I took a picture of it.  Trichomonas vaginitis: Had recent Trich and completed A/B 1 week ago. She requested TOC. She is currently asymptomatic.  PERTINENT  PMH / PSH: PMX reviewed  OBJECTIVE:   BP 110/70   Pulse 76   Ht 5\' 9"  (1.753 m)   Wt 253 lb 6.4 oz (114.9 kg)   LMP 07/14/2021 (Exact Date)   SpO2 100%   BMI 37.42 kg/m   Physical Exam Chaperone present: Chaperone offered, but she stated she was fine without one..  Constitutional:      Appearance: Normal appearance.  Skin:    Comments: Approximately 2-3 cm linear, raised, mildly tender, non-erythematous lesion on her left axilla with a small pinpoint opening with very scanty leakage of clear liquid.       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 I discussed risk factors and conservative measures to prevent recurrence. Trial of oral A/B for 10 days - Doxycycline prescribed. Consider referral to surgery if worsening. She agreed with the plan.  Trichomonas vaginitis She completed course of Metronidazole treatment a week ago. As discussed, TOC is recommended for 3 weeks after treatment if pregnant and after 3 months in non-pregnant patients to avoid false positivity. She will schedule PCP f/u in a few weeks for TOC or sooner if she becomes symptomatic. She agreed with the plan.     07/16/2021, MD [facility]

## 2021-07-27 NOTE — Assessment & Plan Note (Signed)
Recurrent with poor response to topical A/B. I discussed risk factors and conservative measures to prevent recurrence. Trial of oral A/B for 10 days - Doxycycline prescribed. Consider referral to surgery if worsening. She agreed with the plan.

## 2021-07-27 NOTE — Patient Instructions (Signed)
Hidradenitis Suppurativa Hidradenitis suppurativa is a long-term (chronic) skin disease. It is similar to a severe form of acne, but it affects areas of the body where acne would be unusual, especially areas of the body where skin rubs against skin and becomes moist. These include: Underarms. Groin. Genital area. Buttocks. Upper thighs. Breasts. Hidradenitis suppurativa may start out as small lumps or pimples caused by blocked sweat glands or hair follicles. Pimples may develop into deep sores that break open (rupture) and drain pus. Over time, affected areas of skin may thicken and become scarred. This condition is rare and does not spread from person to person (non-contagious). What are the causes? The exact cause of this condition is not known. It may be related to: Female and female hormones. An overactive disease-fighting system (immune system). The immune system may over-react to blocked hair follicles or sweat glands and cause swelling and pus-filled sores. What increases the risk? You are more likely to develop this condition if you: Are female. Are 11-55 years old. Have a family history of hidradenitis suppurativa. Have a personal history of acne. Are overweight. Smoke. Take the medicine lithium. What are the signs or symptoms? The first symptoms are usually painful bumps in the skin, similar to pimples. The condition may get worse over time (progress), or it may only cause mild symptoms. If the disease progresses, symptoms may include: Skin bumps getting bigger and growing deeper into the skin. Bumps rupturing and draining pus. Itchy, infected skin. Skin getting thicker and scarred. Tunnels under the skin (fistulas) where pus drains from a bump. Pain during daily activities, such as pain during walking if your groin area is affected. Emotional problems, such as stress or depression. This condition may affect your appearance and your ability or willingness to wear certain clothes  or do certain activities. How is this diagnosed? This condition is diagnosed by a health care provider who specializes in skin diseases (dermatologist). You may be diagnosed based on: Your symptoms and medical history. A physical exam. Testing a pus sample for infection. Blood tests. How is this treated? Your treatment will depend on how severe your symptoms are. The same treatment will not work for everybody with this condition. You may need to try several treatments to find what works best for you. Treatment may include: Cleaning and bandaging (dressing) your wounds as needed. Lifestyle changes, such as new skin care routines. Taking medicines, such as: Antibiotics. Acne medicines. Medicines to reduce the activity of the immune system. A diabetes medicine (metformin). Birth control pills, for women. Steroids to reduce swelling and pain. Working with a mental health care provider, if you experience emotional distress due to this condition. If you have severe symptoms that do not get better with medicine, you may need surgery. Surgery may involve: Using a laser to clear the skin and remove hair follicles. Opening and draining deep sores. Removing the areas of skin that are diseased and scarred. Follow these instructions at home: Medicines  Take over-the-counter and prescription medicines only as told by your health care provider. If you were prescribed an antibiotic medicine, take it as told by your health care provider. Do not stop taking the antibiotic even if your condition improves. Skin care If you have open wounds, cover them with a clean dressing as told by your health care provider. Keep wounds clean by washing them gently with soap and water when you bathe. Do not shave the areas where you get hidradenitis suppurativa. Do not wear deodorant. Wear loose-fitting   clothes. Try to avoid getting overheated or sweaty. If you get sweaty or wet, change into clean, dry clothes as soon  as you can. To help relieve pain and itchiness, cover sore areas with a warm, clean washcloth (warm compress) for 5-10 minutes as often as needed. If told by your health care provider, take a bleach bath twice a week: Fill your bathtub halfway with water. Pour in  cup of unscented household bleach. Soak in the tub for 5-10 minutes. Only soak from the neck down. Avoid water on your face and hair. Shower to rinse off the bleach from your skin. General instructions Learn as much as you can about your disease so that you have an active role in your treatment. Work closely with your health care provider to find treatments that work for you. If you are overweight, work with your health care provider to lose weight as recommended. Do not use any products that contain nicotine or tobacco, such as cigarettes and e-cigarettes. If you need help quitting, ask your health care provider. If you struggle with living with this condition, talk with your health care provider or work with a mental health care provider as recommended. Keep all follow-up visits as told by your health care provider. This is important. Where to find more information Hidradenitis Suppurativa Foundation, Inc.: https://www.hs-foundation.org/ American Academy of Dermatology: https://www.aad.org Contact a health care provider if you have: A flare-up of hidradenitis suppurativa. A fever or chills. Trouble controlling your symptoms at home. Trouble doing your daily activities because of your symptoms. Trouble dealing with emotional problems related to your condition. Summary Hidradenitis suppurativa is a long-term (chronic) skin disease. It is similar to a severe form of acne, but it affects areas of the body where acne would be unusual. The first symptoms are usually painful bumps in the skin, similar to pimples. The condition may only cause mild symptoms, or it may get worse over time (progress). If you have open wounds, cover them  with a clean dressing as told by your health care provider. Keep wounds clean by washing them gently with soap and water when you bathe. Besides skin care, treatment may include medicines, laser treatment, and surgery. This information is not intended to replace advice given to you by your health care provider. Make sure you discuss any questions you have with your health care provider. Document Revised: 09/06/2020 Document Reviewed: 09/06/2020 Elsevier Patient Education  2022 [employer] Inc.  

## 2021-07-27 NOTE — Assessment & Plan Note (Signed)
She completed course of Metronidazole treatment a week ago. As discussed, TOC is recommended for 3 weeks after treatment if pregnant and after 3 months in non-pregnant patients to avoid false positivity. She will schedule PCP f/u in a few weeks for TOC or sooner if she becomes symptomatic. She agreed with the plan.

## 2021-09-29 ENCOUNTER — Other Ambulatory Visit: Payer: Self-pay

## 2021-09-29 ENCOUNTER — Other Ambulatory Visit (HOSPITAL_COMMUNITY)
Admission: RE | Admit: 2021-09-29 | Discharge: 2021-09-29 | Disposition: A | Discharge status: Home / Self Care | Payer: Medicaid Other | Source: Ambulatory Visit | Attending: Family Medicine | Admitting: Family Medicine

## 2021-09-29 ENCOUNTER — Ambulatory Visit (INDEPENDENT_AMBULATORY_CARE_PROVIDER_SITE_OTHER): Payer: Self-pay | Admitting: Family Medicine

## 2021-09-29 ENCOUNTER — Other Ambulatory Visit: Payer: Self-pay | Admitting: Family Medicine

## 2021-09-29 DIAGNOSIS — R070 Pain in throat: Secondary | ICD-10-CM | POA: Insufficient documentation

## 2021-09-29 DIAGNOSIS — N898 Other specified noninflammatory disorders of vagina: Secondary | ICD-10-CM

## 2021-09-29 DIAGNOSIS — H6122 Impacted cerumen, left ear: Secondary | ICD-10-CM

## 2021-09-29 DIAGNOSIS — R52 Pain, unspecified: Secondary | ICD-10-CM

## 2021-09-29 DIAGNOSIS — A599 Trichomoniasis, unspecified: Secondary | ICD-10-CM

## 2021-09-29 LAB — POCT WET PREP (WET MOUNT): Clue Cells Wet Prep Whiff POC: NEGATIVE

## 2021-09-29 LAB — POCT INFLUENZA A/B
Influenza A, POC: NEGATIVE
Influenza B, POC: POSITIVE — AB

## 2021-09-29 MED ORDER — METRONIDAZOLE 500 MG PO TABS
500.0000 mg | ORAL_TABLET | Freq: Two times a day (BID) | ORAL | 0 refills | Status: AC
Start: 2021-09-29 — End: 2021-10-06

## 2021-09-29 NOTE — Patient Instructions (Addendum)
It was great seeing you today!  You were seen for STI check and also with throat pain, congestion, body aches. We are testing for flu and I recommend the daily flonase to help with the congestion. I will call you with any abnormal results, and will send a mychart message with normal results.   You do have wax was blocking your left ear so I recommend using over the counter mineral oil or debrox drops (details below). Use 3-4 drops about 3 times a day until you see the wax removed. Tilt your head after you apply the drops so that it can drain.   Return if symptoms do not improve after another 1-2 weeks   Feel free to call with any questions or concerns at any time, at [phone].   Take care,  Dr. Cora Collum [facility]   Carbamide Peroxide ear solution What is this medication? CARBAMIDE PEROXIDE (CAR bah mide  per OX ide) is used to soften and help remove ear wax. This medicine may be used for other purposes; ask your health care provider or pharmacist if you have questions. COMMON BRAND NAME(S): Auro Ear, Auro Earache Relief, Debrox, Ear Drops, Ear Wax Removal, Ear Wax Remover, Earwax Treatment, Murine, Thera-Ear What should I tell my care team before I take this medication? They need to know if you have any of these conditions: dizziness ear discharge ear pain, irritation or rash infection perforated eardrum (hole in eardrum) an unusual or allergic reaction to carbamide peroxide, glycerin, hydrogen peroxide, other medicines, foods, dyes, or preservatives pregnant or trying to get pregnant breast-feeding How should I use this medication? This medicine is only for use in the outer ear canal. Follow the directions carefully. Wash hands before and after use. The solution may be warmed by holding the bottle in the hand for 1 to 2 minutes. Lie with the affected ear facing upward. Place the proper number of drops into the ear canal. After the drops are  instilled, remain lying with the affected ear upward for 5 minutes to help the drops stay in the ear canal. A cotton ball may be gently inserted at the ear opening for no longer than 5 to 10 minutes to ensure retention. Repeat, if necessary, for the opposite ear. Do not touch the tip of the dropper to the ear, fingertips, or other surface. Do not rinse the dropper after use. Keep container tightly closed. Talk to your pediatrician regarding the use of this medicine in children. While this drug may be used in children as young as 12 years for selected conditions, precautions do apply. Overdosage: If you think you have taken too much of this medicine contact a poison control center or emergency room at once. NOTE: This medicine is only for you. Do not share this medicine with others. What if I miss a dose? If you miss a dose, use it as soon as you can. If it is almost time for your next dose, use only that dose. Do not use double or extra doses. What may interact with this medication? Interactions are not expected. Do not use any other ear products without asking your doctor or health care professional. This list may not describe all possible interactions. Give your health care provider a list of all the medicines, herbs, non-prescription drugs, or dietary supplements you use. Also tell them if you smoke, drink alcohol, or use illegal drugs. Some items may interact with your medicine. What should I watch for while  using this medication? This medicine is not for long-term use. Do not use for more than 4 days without checking with your health care professional. Contact your doctor or health care professional if your condition does not start to get better within a few days or if you notice burning, redness, itching or swelling. What side effects may I notice from receiving this medication? Side effects that you should report to your doctor or health care professional as soon as possible: allergic reactions  like skin rash, itching or hives, swelling of the face, lips, or tongue burning, itching, and redness worsening ear pain rash Side effects that usually do not require medical attention (report to your doctor or health care professional if they continue or are bothersome): abnormal sensation while putting the drops in the ear temporary reduction in hearing (but not complete loss of hearing) This list may not describe all possible side effects. Call your doctor for medical advice about side effects. You may report side effects to FDA at 1-800-FDA-1088. Where should I keep my medication? Keep out of the reach of children. Store at room temperature between 15 and 30 degrees C (59 and 86 degrees F) in a tight, light-resistant container. Keep bottle away from excessive heat and direct sunlight. Throw away any unused medicine after the expiration date. NOTE: This sheet is a summary. It may not cover all possible information. If you have questions about this medicine, talk to your doctor, pharmacist, or health care provider.  2022 Elsevier/Gold Standard (2008-04-23 00:00:00)

## 2021-09-29 NOTE — Progress Notes (Signed)
    SUBJECTIVE:   CHIEF COMPLAINT / HPI:   Ann [name] is a 25 yo who presents for STI check. About 1-2 months ago tested positive for trichomonas. Today she endorses irritation, itching. Denies burning. Does not notice increased discharge. Felt like she had a fever about 2 weeks ago. Endorses being with the same partner. Partner will get testsed as well   She also endorses throat pain, stating it hurts to swallow and is sometimes hard to breathe. Coughing at night. Had a tea last week that helped but pain comes back. Tried taking pseudofed D. Still feels pressure in head and ears. Also endorses some body aches.   States she was COVID negative at home     OBJECTIVE:   There were no vitals taken for this visit.   Physical exam  General: well appearing, NAD HEENT: MMM. Oropharynx clear without exudates and non erythematous. L TM with cerumen impaction  Cardiovascular: RRR, no murmurs Lungs: CTAB. Normal WOB Abdomen: soft, non-distended, non-tender Skin: warm, dry. No edema Pelvic exam: normal external genitalia, vulva, vagina, cervix, uterus and adnexa.   ASSESSMENT/PLAN:   No problem-specific Assessment & Plan notes found for this encounter.  Throat pain and congestion Endorses throat pain, coughing, congestion, difficulty swallowing that has improved. She has remained afebrile. On exam oropharynx non erythematous and without exudates. Tested for flu and also swabbed for GC. Patient requested steroids for the congestion but discussed that it would not be best for her current symptoms given no wheezing on exam. Discussed symptomatic management.  - f/u flu results   STI screen Positive for trichomonas 1-2 months ago. Endorses irritation, itching, burning. No change in partner. Pelvic exam unremarkable. Desires to be tested again. - f/u GC, wet prep    L ear cerumen impaction  Asymptomatic, without pain or hearing loss.  Complete cerumen impaction of left ear.  Discussed getting  over-the-counter mineral oil or Debrox drops to use about 3 to 4 drops 3 times a day until able to adequately remove the wax.   Cora Collum, DO [facility]

## 2021-10-02 LAB — CERVICOVAGINAL ANCILLARY ONLY
Chlamydia: NEGATIVE
Chlamydia: NEGATIVE
Comment: NEGATIVE
Comment: NEGATIVE
Comment: NEGATIVE
Comment: NORMAL
Comment: NORMAL
Neisseria Gonorrhea: NEGATIVE
Neisseria Gonorrhea: NEGATIVE
Trichomonas: POSITIVE — AB

## 2021-10-12 ENCOUNTER — Ambulatory Visit (INDEPENDENT_AMBULATORY_CARE_PROVIDER_SITE_OTHER): Payer: Self-pay | Admitting: Family Medicine

## 2021-10-12 ENCOUNTER — Other Ambulatory Visit: Payer: Self-pay

## 2021-10-12 VITALS — BP 110/70 | HR 80 | Ht 69.0 in | Wt 249.8 lb

## 2021-10-12 DIAGNOSIS — L732 Hidradenitis suppurativa: Secondary | ICD-10-CM

## 2021-10-12 DIAGNOSIS — N6081 Other benign mammary dysplasias of right breast: Secondary | ICD-10-CM

## 2021-10-12 DIAGNOSIS — L0291 Cutaneous abscess, unspecified: Secondary | ICD-10-CM

## 2021-10-12 DIAGNOSIS — G8929 Other chronic pain: Secondary | ICD-10-CM

## 2021-10-12 DIAGNOSIS — M549 Dorsalgia, unspecified: Secondary | ICD-10-CM

## 2021-10-12 DIAGNOSIS — N644 Mastodynia: Secondary | ICD-10-CM

## 2021-10-12 DIAGNOSIS — R894 Abnormal immunological findings in specimens from other organs, systems and tissues: Secondary | ICD-10-CM

## 2021-10-12 MED ORDER — VALACYCLOVIR HCL 1 G PO TABS: 1000.0000 mg | ORAL_TABLET | Freq: Every day | ORAL | 1 refills | Status: DC

## 2021-10-12 MED ORDER — NAPROXEN 500 MG PO TABS: 500.0000 mg | ORAL_TABLET | Freq: Two times a day (BID) | ORAL | 0 refills | Status: DC

## 2021-10-12 MED ORDER — CLINDAMYCIN PHOSPHATE 1 % EX SOLN: Freq: Two times a day (BID) | CUTANEOUS | 0 refills | Status: DC

## 2021-10-12 NOTE — Patient Instructions (Addendum)
Thank you for coming to see me today. It was a pleasure. Today we talked about:   Ultrasound of breast ordered  Referral to Plastic surgery for breast reduction evaluation  Refills of medications  Please follow-up with PCP as needed   If you have any questions or concerns, please do not hesitate to call the office at [phone].  Best,   Dana Allan, MD

## 2021-10-12 NOTE — Progress Notes (Signed)
    SUBJECTIVE:   CHIEF COMPLAINT / HPI: breast lump and medication for HSV  Reports has history of HS 1 month ago.  Found boil under left breast.  Squeezed it and noted a small amount of yellow discharge.  Has slightly improved but still tender to touch.    PERTINENT  PMH / PSH:  HS Chronic Back pain Obesity class 1 HSV 1  OBJECTIVE:   BP 110/70   Pulse 80   Ht 5\' 9"  (1.753 m)   Wt 249 lb 12.8 oz (113.3 kg)   LMP 10/05/2021 (Exact Date)   SpO2 98%   BMI 36.89 kg/m    General: Alert, no acute distress Right breast:  normal appearance, no skin changes, nipple discharge appreciated. Small firm, mobile tender cyst like mass palpated along right sternal border, medial side of right breast  ASSESSMENT/PLAN:   Sebaceous cyst of breast No visible mass on exam.  Palpable small firm tender mobile cyst like mass noted on medial side of right breast at lower right sternal border. No nipple discharge.  Normal left breast tissue without nipple discharge. Low suspicion for Breast carcinoma -Has history of fibrocystic brest -Bilateral breast u/s, patient will need to check with insurance or apply for [facility] financial assistance  -Follow up with PCP as needed  Chronic upper back pain Requesting refill Naproxen for upper back pain.  Offered plastics referral for breast reduction to help with pain relief.  Patient opted for referral.   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  -Follow up with PCP as needed     13/08/2021, MD [facility]

## 2021-10-13 ENCOUNTER — Other Ambulatory Visit: Payer: Self-pay | Admitting: Family Medicine

## 2021-10-14 ENCOUNTER — Encounter: Payer: Self-pay | Admitting: Family Medicine

## 2021-10-15 ENCOUNTER — Encounter: Payer: Self-pay | Admitting: Family Medicine

## 2021-10-15 DIAGNOSIS — G8929 Other chronic pain: Secondary | ICD-10-CM | POA: Insufficient documentation

## 2021-10-15 DIAGNOSIS — N6089 Other benign mammary dysplasias of unspecified breast: Secondary | ICD-10-CM | POA: Insufficient documentation

## 2021-10-15 DIAGNOSIS — M549 Dorsalgia, unspecified: Secondary | ICD-10-CM | POA: Insufficient documentation

## 2021-10-15 NOTE — Assessment & Plan Note (Signed)
Requesting refill Naproxen for upper back pain.  Offered plastics referral for breast reduction to help with pain relief.  Patient opted for referral.   -Referral to plastics for evaluation -Naproxen 500 mg BID x  30 tabs -Follow up with PCP as needed

## 2021-10-15 NOTE — Assessment & Plan Note (Signed)
Refill Clindamycin 1% solution BID for flare up Follow up with PCP as needed

## 2021-10-15 NOTE — Assessment & Plan Note (Signed)
No current break out. 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  -Follow up with PCP as needed

## 2021-10-15 NOTE — Assessment & Plan Note (Addendum)
No visible mass on exam.  Palpable small firm tender mobile cyst like mass noted on medial side of right breast at lower right sternal border. No nipple discharge.  Normal left breast tissue without nipple discharge. Low suspicion for Breast carcinoma -Has history of fibrocystic brest -Bilateral breast u/s, patient will need to check with insurance or apply for [facility] financial assistance  -Follow up with PCP as needed

## 2021-10-27 ENCOUNTER — Ambulatory Visit: Payer: Medicaid Other

## 2021-11-01 ENCOUNTER — Other Ambulatory Visit: Payer: Self-pay

## 2021-11-01 ENCOUNTER — Ambulatory Visit (INDEPENDENT_AMBULATORY_CARE_PROVIDER_SITE_OTHER): Payer: Self-pay | Admitting: Family Medicine

## 2021-11-01 ENCOUNTER — Other Ambulatory Visit (HOSPITAL_COMMUNITY)
Admission: RE | Admit: 2021-11-01 | Discharge: 2021-11-01 | Disposition: A | Discharge status: Home / Self Care | Payer: Medicaid Other | Source: Ambulatory Visit | Attending: Family Medicine | Admitting: Family Medicine

## 2021-11-01 ENCOUNTER — Other Ambulatory Visit: Payer: Self-pay | Admitting: Family Medicine

## 2021-11-01 VITALS — BP 109/85 | HR 103 | Wt 251.0 lb

## 2021-11-01 DIAGNOSIS — E119 Type 2 diabetes mellitus without complications: Secondary | ICD-10-CM | POA: Insufficient documentation

## 2021-11-01 DIAGNOSIS — N898 Other specified noninflammatory disorders of vagina: Secondary | ICD-10-CM | POA: Diagnosis present

## 2021-11-01 DIAGNOSIS — Z113 Encounter for screening for infections with a predominantly sexual mode of transmission: Secondary | ICD-10-CM

## 2021-11-01 DIAGNOSIS — R7303 Prediabetes: Secondary | ICD-10-CM

## 2021-11-01 DIAGNOSIS — L732 Hidradenitis suppurativa: Secondary | ICD-10-CM

## 2021-11-01 DIAGNOSIS — A5901 Trichomonal vulvovaginitis: Secondary | ICD-10-CM

## 2021-11-01 DIAGNOSIS — B3731 Acute candidiasis of vulva and vagina: Secondary | ICD-10-CM

## 2021-11-01 DIAGNOSIS — N6081 Other benign mammary dysplasias of right breast: Secondary | ICD-10-CM

## 2021-11-01 DIAGNOSIS — B9689 Other specified bacterial agents as the cause of diseases classified elsewhere: Secondary | ICD-10-CM

## 2021-11-01 DIAGNOSIS — B379 Candidiasis, unspecified: Secondary | ICD-10-CM

## 2021-11-01 LAB — POCT GLYCOSYLATED HEMOGLOBIN (HGB A1C): Hemoglobin A1C: 6.1 % — AB (ref 4.0–5.6)

## 2021-11-01 LAB — POCT WET PREP (WET MOUNT)
Clue Cells Wet Prep Whiff POC: POSITIVE
Trichomonas Wet Prep HPF POC: ABSENT

## 2021-11-01 MED ORDER — FLUCONAZOLE 150 MG PO TABS: 150.0000 mg | ORAL_TABLET | Freq: Once | ORAL | 0 refills | Status: AC

## 2021-11-01 MED ORDER — METRONIDAZOLE 0.75 % EX GEL: 1.0000 "application " | Freq: Every day | CUTANEOUS | 0 refills | Status: AC

## 2021-11-01 MED ORDER — DOXYCYCLINE HYCLATE 100 MG PO TABS: 100.0000 mg | ORAL_TABLET | Freq: Every day | ORAL | 0 refills | Status: AC

## 2021-11-01 NOTE — Progress Notes (Addendum)
SUBJECTIVE:   CHIEF COMPLAINT / HPI:   Vaginitis - would like STI screen for test of cure for trichomonas - last screen 11/04, trich (+), PAP NILM, G/C (-) - Has finished her medication without complications - Feels as though she has developed a yeast infection  Breast pain, nodule - Painful palpable nodule of the medial right breast - Last seen 11/17, provider notes below - Has not yet received ultrasound of the breast as her insurance will not cover this at this time - No drainage, change in size, worsening pain since last appointment - She reports she does have a history of hidradenitis suppurativa, says that previously the breast center prescribed her doxycycline and that helped with her other cysts so she is wondering if she can try that today  [facility] 10/12/21 HPI Reports has history of HS 1 month ago.  Found boil under left breast.  Squeezed it and noted a small amount of yellow discharge.  Has slightly improved but still tender to touch.  A&P - Sebaceous cyst of breast No visible mass on exam.  Palpable small firm tender mobile cyst like mass noted on medial side of right breast at lower right sternal border. No nipple discharge.  Normal left breast tissue without nipple discharge. 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 08/31/2020   Contraception management 05/20/2019   Screen for STD (sexually transmitted disease) 08/09/2016   Obesity (BMI 35.0-39.9  without comorbidity) 05/06/2009   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   ECZEMA, ATOPIC DERMATITIS 01/23/2007     OBJECTIVE:   BP 109/85   Pulse (!) 103   Wt 251 lb (113.9 kg)   LMP 10/05/2021 (Exact Date)   BMI 37.07 kg/m   Physical Exam General: Awake, alert, oriented, no acute distress Respiratory: Normal work of breathing, no respiratory distress Chest: Firm palpable mobile nodule of medial right breast at level of fifth or sixth rib, no surrounding erythema, swelling, exudate, warmth Neuro: Cranial nerves II through X grossly intact, able to move all extremities spontaneously Vulva: Normal appearing vulva without rashes, lesions, or deformities Vagina: Pale pink rugated vaginal tissue without obvious lesions, thickened discharge of whitish color, cervix without lesion or overt tenderness with swab   ASSESSMENT/PLAN:   Trichomonas vaginitis Patient would like test of cure today.  Vaginal swab obtained.  Discussed possibility of false positive given that we are only 3 weeks posttreatment.  Patient amenable, answered all questions.  Screen for STD (sexually transmitted disease) Per patient request, blood collected to screen for HIV, RPR today as well.  We will follow-up results.  Candida vaginitis Patient experiencing symptoms of candidal vaginitis, physical exam in agreement.  Wet prep not yet back yet.  We will treat empirically with 1 dose of fluconazole.  We will follow-up with results and treat accordingly.  Prediabetes Stable.  Patient requesting blood sugar check today.  She is wondering if her prediabetic status is worsening and thus contributing to worsened hidradenitis suppurativa.  A1c 6.1%, stable from last check.  Sebaceous cyst of breast Stable.  No improvement.  Patient has not yet received ultrasound, insurance will not cover at this time.  Given history of hidradenitis suppurativa and previous response of cysts to doxycycline, will trial 1 week course of doxycycline.   Discussed with patient that if the nodule does not improve with antibiotics that we should proceed with ultrasound as previously ordered.  Patient amenable, answered all questions.     Fayette Pho, MD [facility]

## 2021-11-01 NOTE — Assessment & Plan Note (Signed)
Stable.  Patient requesting blood sugar check today.  She is wondering if her prediabetic status is worsening and thus contributing to worsened hidradenitis suppurativa.  A1c 6.1%, stable from last check.

## 2021-11-01 NOTE — Assessment & Plan Note (Signed)
Patient would like test of cure today.  Vaginal swab obtained.  Discussed possibility of false positive given that we are only 3 weeks posttreatment.  Patient amenable, answered all questions.

## 2021-11-01 NOTE — Patient Instructions (Signed)
It was wonderful to meet you today. Thank you for allowing me to be a part of your care. Below is a short summary of what we discussed at your visit today:  STI screening - Today we obtained a vaginal swab to screen for gonorrhea, chlamydia, and trichomonas - We also obtained a blood sample to screen for HIV and syphilis - As above, if the results are normal, I will send you a letter or MyChart message. If the results are abnormal, I will give you a call.     Presumptive yeast infection - Take fluconazole pill 150 mg one time  Diabetes screening Today we got an A1c on you. This is a blood sugar average. You may see your number in your MyChart app once the result is back. Prediabetes is considered 5.7-6.5, diabetes is anything over 6.5.   Breast cyst Take doxycycline once daily for 7 days. If it does not resolve, we will need to do an ultrasound.    Please bring all of your medications to every appointment!  If you have any questions or concerns, please do not hesitate to contact us via phone or MyChart message.   Fayette Pho, MD

## 2021-11-01 NOTE — Assessment & Plan Note (Signed)
Per patient request, blood collected to screen for HIV, RPR today as well.  We will follow-up results.

## 2021-11-01 NOTE — Assessment & Plan Note (Signed)
Patient experiencing symptoms of candidal vaginitis, physical exam in agreement.  Wet prep not yet back yet.  We will treat empirically with 1 dose of fluconazole.  We will follow-up with results and treat accordingly.

## 2021-11-01 NOTE — Progress Notes (Signed)
Called patient to relay results of wet prep.  BV positive, yeast negative.  I will send a prescription for metronidazole gel to be used intravaginally once daily for 5 days.  All questions answered.  If metronidazole gel is too expensive under her insurance, is acceptable to substitute metronidazole pills 500 mg twice daily x7 days.  Fayette Pho, MD

## 2021-11-02 LAB — RPR: RPR Ser Ql: NONREACTIVE

## 2021-11-02 LAB — HIV ANTIBODY (ROUTINE TESTING W REFLEX): HIV Screen 4th Generation wRfx: NONREACTIVE

## 2021-11-02 NOTE — Assessment & Plan Note (Signed)
Stable.  No improvement.  Patient has not yet received ultrasound, insurance will not cover at this time.  Given history of hidradenitis suppurativa and previous response of cysts to doxycycline, will trial 1 week course of doxycycline.  Discussed with patient that if the nodule does not improve with antibiotics that we should proceed with ultrasound as previously ordered.  Patient amenable, answered all questions.

## 2021-11-03 LAB — CERVICOVAGINAL ANCILLARY ONLY
Chlamydia: NEGATIVE
Comment: NEGATIVE
Comment: NORMAL
Neisseria Gonorrhea: NEGATIVE

## 2021-12-14 ENCOUNTER — Ambulatory Visit: Payer: Medicaid Other | Admitting: Family Medicine

## 2021-12-15 NOTE — Progress Notes (Deleted)
° ° °  SUBJECTIVE:   CHIEF COMPLAINT / HPI:   Vaginal Discharge: Patient is a 26 y.o. female presenting with vaginal discharge for *** days.  She states the discharge is of *** consistency.  She endorses *** vaginal odor.  She is interested in screening for sexually transmitted infections today.  PERTINENT  PMH / PSH: Trichomonas, HSV, candida  OBJECTIVE:   There were no vitals taken for this visit. ***  General: NAD, pleasant, able to participate in exam Respiratory: Normal effort, no obvious respiratory distress Pelvic: VULVA: normal appearing vulva with no masses, tenderness or lesions, VAGINA: Normal appearing vagina with normal color, no lesions, with {GYN VAGINAL DISCHARGE:21986} discharge present, ***CERVIX: No lesions, {GYN VAGINAL DISCHARGE:21986} discharge present,  Chaperone *** present for pelvic exam  ASSESSMENT/PLAN:   No problem-specific Assessment & Plan notes found for this encounter.    Assessment:  26 y.o. female with vaginal discharge for***days, as well as***.  Physical exam significant for*** discharge.  Wet prep performed today shows *** consistent with ***.  Patient is interested in STI screening.   Plan: -Wet prep as above.  Will treat with***. -GC/chlamydia pending -Will check HIV and RPR  Sabino Dick, DO [facility]

## 2021-12-15 NOTE — Patient Instructions (Incomplete)
It was wonderful to see you today. ? ?Please bring ALL of your medications with you to every visit.  ? ?Today we talked about: ? ?-We are checking for sexually transmitted infections including chlamydia, gonorrhea, trichomonas, HIV, syphilis and hepatitis B. I will let you know of the results via MyChart or telephone call. We are also checking for bacterial vaginosis and yeast, which are not sexually transmitted infections.  ?-You should abstain from sexual activity until we have the results. If your test is positive for a sexually transmitted infection, it is important that both you and your partner are both treated.  ?-It is always important to use barrier protection, such as condoms, to help prevent sexually transmitted infections.   ? ? ?Thank you for choosing [facility].  ? ?Please call [phone] with any questions about today's appointment. ? ?Please be sure to schedule follow up at the front  desk before you leave today.  ? ?Yamilett Anastos, DO ?PGY-2 Family Medicine   ?

## 2021-12-18 ENCOUNTER — Other Ambulatory Visit: Payer: Self-pay

## 2021-12-18 ENCOUNTER — Encounter: Payer: Self-pay | Admitting: Family Medicine

## 2021-12-18 ENCOUNTER — Ambulatory Visit: Payer: Medicaid Other

## 2021-12-18 ENCOUNTER — Ambulatory Visit (INDEPENDENT_AMBULATORY_CARE_PROVIDER_SITE_OTHER): Payer: Self-pay | Admitting: Family Medicine

## 2021-12-18 ENCOUNTER — Other Ambulatory Visit (HOSPITAL_COMMUNITY)
Admission: RE | Admit: 2021-12-18 | Discharge: 2021-12-18 | Disposition: A | Discharge status: Home / Self Care | Payer: Medicaid Other | Source: Ambulatory Visit | Attending: Family Medicine | Admitting: Family Medicine

## 2021-12-18 VITALS — BP 114/83 | HR 75 | Wt 257.0 lb

## 2021-12-18 DIAGNOSIS — Z113 Encounter for screening for infections with a predominantly sexual mode of transmission: Secondary | ICD-10-CM | POA: Diagnosis present

## 2021-12-18 DIAGNOSIS — Z114 Encounter for screening for human immunodeficiency virus [HIV]: Secondary | ICD-10-CM

## 2021-12-18 DIAGNOSIS — Z3200 Encounter for pregnancy test, result unknown: Secondary | ICD-10-CM

## 2021-12-18 DIAGNOSIS — B9689 Other specified bacterial agents as the cause of diseases classified elsewhere: Secondary | ICD-10-CM

## 2021-12-18 DIAGNOSIS — N760 Acute vaginitis: Secondary | ICD-10-CM

## 2021-12-18 DIAGNOSIS — N898 Other specified noninflammatory disorders of vagina: Secondary | ICD-10-CM

## 2021-12-18 LAB — POCT URINE PREGNANCY: Preg Test, Ur: NEGATIVE

## 2021-12-18 MED ORDER — METRONIDAZOLE 0.75 % VA GEL: VAGINAL | 0 refills | Status: DC

## 2021-12-18 NOTE — Patient Instructions (Signed)
It was a pleasure to see you today! ? ?We will get some labs today.  If they are abnormal or we need to do something about them, I will call you.  If they are normal, I will send you a message on MyChart (if it is active) or a letter in the mail.  If you don't hear from us in 2 weeks, please call the office  [phone]. ? ? ?Be Well, ? ?Dr. Oney Tatlock ? ?

## 2021-12-18 NOTE — Progress Notes (Signed)
° ° °  SUBJECTIVE:   CHIEF COMPLAINT / HPI:   Vaginal Odor: Patient is a 26 y.o. female presenting with slight fishy vaginal odor. She denies any pruritus or discharge. She is interested in screening for sexually transmitted infections today. Patient is UTD with pap smear, NILM in 04/2019. She will need repeat pap in June 2023. LMP January 9-12. Recently had unprotected sex; patient is not on birth control and declines birth control after counseling. Will obtain urine pregnancy test as well. Discussed plan B and prenatal vitamins.  PERTINENT  PMH / PSH: None relevant  OBJECTIVE:   BP 114/83    Pulse 75    Wt 257 lb (116.6 kg)    LMP 12/04/2021 (Exact Date)    BMI 37.95 kg/m    General: NAD, pleasant, able to participate in exam Respiratory: Normal effort, no obvious respiratory distress Pelvic: VULVA: normal appearing vulva with no masses, tenderness or lesions, VAGINA: Normal appearing vagina with normal color, no lesions, with scant and white discharge present, CERVIX: No lesions, scant and white discharge present  ASSESSMENT/PLAN:   Screen for STD (sexually transmitted disease) 26 y.o. female with vaginal odor for several days.  Wet prep performed today shows clue cells and + whiff consistent with BV.  Patient is interested in STI screening.   Plan: -Wet prep as above.  Will treat with metro gel vaginal 0.75% qhs x5d per patient request. -GC/chlamydia pending -Will check HIV and RPR    Shirlean Mylar, MD [facility]

## 2021-12-18 NOTE — Assessment & Plan Note (Addendum)
26 y.o. female with vaginal odor for several days.  Wet prep performed today shows clue cells and + whiff consistent with BV.  Patient is interested in STI screening.   Plan: -Wet prep as above.  Will treat with metro gel vaginal 0.75% qhs x5d per patient request. -GC/chlamydia pending -Will check HIV and RPR

## 2021-12-19 LAB — CERVICOVAGINAL ANCILLARY ONLY
Chlamydia: NEGATIVE
Comment: NEGATIVE
Comment: NORMAL
Neisseria Gonorrhea: NEGATIVE

## 2021-12-19 LAB — HIV ANTIBODY (ROUTINE TESTING W REFLEX): HIV Screen 4th Generation wRfx: NONREACTIVE

## 2021-12-19 LAB — RPR: RPR Ser Ql: NONREACTIVE

## 2022-01-11 ENCOUNTER — Ambulatory Visit: Payer: Medicaid Other | Admitting: Family Medicine

## 2022-01-11 NOTE — Progress Notes (Unsigned)
° ° ° °  SUBJECTIVE:   CHIEF COMPLAINT / HPI:   Ann [name] is a 26 y.o. female presents for ***   ***  Flowsheet Row Office Visit from 12/18/2021 in [facility]  PHQ-9 Total Score 0        Health Maintenance Due  Topic   COVID-19 Vaccine (1)      PERTINENT  PMH / PSH:   OBJECTIVE:   There were no vitals taken for this visit.   General: Alert, no acute distress Cardio: Normal S1 and S2, RRR, no r/m/g Pulm: CTAB, normal work of breathing Abdomen: Bowel sounds normal. Abdomen soft and non-tender.  Extremities: No peripheral edema.  Neuro: Cranial nerves grossly intact   ASSESSMENT/PLAN:   No problem-specific Assessment & Plan notes found for this encounter.    Towanda Octave, MD PGY-3 [facility]

## 2022-01-12 ENCOUNTER — Ambulatory Visit (INDEPENDENT_AMBULATORY_CARE_PROVIDER_SITE_OTHER): Payer: Medicaid Other | Admitting: Family Medicine

## 2022-01-12 ENCOUNTER — Other Ambulatory Visit: Payer: Self-pay

## 2022-01-12 ENCOUNTER — Other Ambulatory Visit (HOSPITAL_COMMUNITY)
Admission: RE | Admit: 2022-01-12 | Discharge: 2022-01-12 | Disposition: A | Discharge status: Home / Self Care | Payer: Medicaid Other | Source: Ambulatory Visit | Attending: Family Medicine | Admitting: Family Medicine

## 2022-01-12 VITALS — BP 95/57 | HR 78 | Ht 69.0 in | Wt 255.0 lb

## 2022-01-12 DIAGNOSIS — L0291 Cutaneous abscess, unspecified: Secondary | ICD-10-CM | POA: Diagnosis not present

## 2022-01-12 DIAGNOSIS — Z3200 Encounter for pregnancy test, result unknown: Secondary | ICD-10-CM | POA: Diagnosis not present

## 2022-01-12 DIAGNOSIS — Z113 Encounter for screening for infections with a predominantly sexual mode of transmission: Secondary | ICD-10-CM

## 2022-01-12 DIAGNOSIS — L732 Hidradenitis suppurativa: Secondary | ICD-10-CM | POA: Diagnosis not present

## 2022-01-12 DIAGNOSIS — Z3202 Encounter for pregnancy test, result negative: Secondary | ICD-10-CM

## 2022-01-12 DIAGNOSIS — N898 Other specified noninflammatory disorders of vagina: Secondary | ICD-10-CM | POA: Diagnosis not present

## 2022-01-12 LAB — POCT WET PREP (WET MOUNT)
Clue Cells Wet Prep Whiff POC: NEGATIVE
Trichomonas Wet Prep HPF POC: ABSENT

## 2022-01-12 LAB — POCT URINE PREGNANCY: Preg Test, Ur: NEGATIVE

## 2022-01-12 MED ORDER — CLINDAMYCIN PHOSPHATE 1 % EX SOLN: Freq: Two times a day (BID) | CUTANEOUS | 0 refills | Status: DC

## 2022-01-12 MED ORDER — VALACYCLOVIR HCL 1 G PO TABS: 1000.0000 mg | ORAL_TABLET | Freq: Every day | ORAL | 1 refills | Status: DC

## 2022-01-12 NOTE — Patient Instructions (Signed)
I am prescribing an antibiotic gel for use on the lesion on your chest.  I like for you to try this twice a day for 14 days and follow-up with your primary doctor if it does not improve/resolve.  Your wet prep was negative and so we do not need treatment for this.  If you develop any other questions or concerns please let me know.  I will follow-up with you with the results of your STD testing when it returns.

## 2022-01-12 NOTE — Progress Notes (Signed)
° ° °  SUBJECTIVE:   CHIEF COMPLAINT / HPI:   Vaginal Discharge: Patient is a 26 y.o. female presenting with vaginal discharge for a few days days.  She states she was previously treated for BV but had an issue getting the medication due to insurance. She is interested in screening for sexually transmitted infections today.  Hidradenitis  States she has a history of this and has a firm lesion present on the medial aspect of her right breast.  It does not have an opening at the top.  She is unsure if maybe she needs an antibiotic or some treatment for it.    PERTINENT  PMH / PSH: None relevant  OBJECTIVE:   BP (!) 95/57    Pulse 78    Ht 5\' 9"  (1.753 m)    Wt 255 lb (115.7 kg)    LMP 01/01/2022    SpO2 100%    BMI 37.66 kg/m    General: NAD, pleasant, able to participate in exam Respiratory: Normal effort, no obvious respiratory distress Skin: Small 1 cm firm nodule palpated in the soft tissue between the breast on the more medial aspect to the right of the breastbone.  There is no overlying opening. Pelvic: VULVA: normal appearing vulva with no masses, tenderness or lesions, VAGINA:  Chaperone Tishira present for pelvic exam  ASSESSMENT/PLAN:   Vaginal discharge:  26 y.o. female with vaginalinal discharge for a few days days.  She was previously prescribed MetroGel but was unable to get this due to insurance issues.  Physical exam significant for scant discharge.  Wet prep performed today is negative.  Patient is interested in STI screening.   Plan: -Wet prep as above.  No treatment necessary -GC/chlamydia pending  Hidradenitis  Patient with 1 lesion medial to the left breast is about 1 cm in diameter with no overlying skin changes and no opening to it.  It does not feel like a palpable abscess.  It is not on the breast and is medial to it. She is interested in antibiotic treatment.  Think is reasonable to just get clindamycin 1% gel on board for the next 14 days to see if it improves her  symptoms.  If not she is going to follow-up with her PCP.  25, DO [facility]

## 2022-01-15 LAB — CERVICOVAGINAL ANCILLARY ONLY
Chlamydia: NEGATIVE
Comment: NEGATIVE
Comment: NORMAL
Neisseria Gonorrhea: NEGATIVE

## 2022-01-25 ENCOUNTER — Ambulatory Visit: Payer: Medicaid Other

## 2022-02-12 ENCOUNTER — Ambulatory Visit: Payer: Medicaid Other

## 2022-02-12 ENCOUNTER — Emergency Department (HOSPITAL_COMMUNITY)
Admission: EM | Admit: 2022-02-12 | Discharge: 2022-02-12 | Disposition: A | Discharge status: Home / Self Care | Payer: Medicaid Other | Attending: Emergency Medicine | Admitting: Emergency Medicine

## 2022-02-12 ENCOUNTER — Encounter (HOSPITAL_COMMUNITY): Payer: Self-pay | Admitting: Emergency Medicine

## 2022-02-12 DIAGNOSIS — N751 Abscess of Bartholin's gland: Secondary | ICD-10-CM | POA: Insufficient documentation

## 2022-02-12 MED ORDER — OXYCODONE-ACETAMINOPHEN 5-325 MG PO TABS
1.0000 | ORAL_TABLET | Freq: Once | ORAL | Status: AC
  Administered 2022-02-12: 1 via ORAL
  Filled 2022-02-12: qty 1

## 2022-02-12 MED ORDER — SULFAMETHOXAZOLE-TRIMETHOPRIM 800-160 MG PO TABS
1.0000 | ORAL_TABLET | Freq: Once | ORAL | Status: AC
  Administered 2022-02-12: 1 via ORAL
  Filled 2022-02-12: qty 1

## 2022-02-12 MED ORDER — LIDOCAINE-EPINEPHRINE (PF) 2 %-1:200000 IJ SOLN
10.0000 mL | Freq: Once | INTRAMUSCULAR | Status: AC
  Administered 2022-02-12: 10 mL via INTRADERMAL
  Filled 2022-02-12: qty 20

## 2022-02-12 MED ORDER — SULFAMETHOXAZOLE-TRIMETHOPRIM 800-160 MG PO TABS: 1.0000 | ORAL_TABLET | Freq: Two times a day (BID) | ORAL | 0 refills | Status: AC

## 2022-02-12 NOTE — ED Provider Triage Note (Signed)
Emergency Medicine Provider Triage Evaluation Note ? ?Ann [name] , a 26 y.o. female  was evaluated in triage.  Pt complains of cyst to her right sided vulva.  Patient has a history of hidradenitis suppurativa. She notes that she is prediabetic.  Patient notes she has had similar symptoms in the past which she set an appointment with her gynecologist but it resolved before the appointment.  Denies history of diabetes.  Denies hematuria, vaginal bleeding, vaginal discharge, discharge from the cyst, fever, chills, abdominal pain. ? ?Review of Systems  ?Positive: As per HPI above ?Negative: As per HPI above ? ?Physical Exam  ?BP 120/89 (BP Location: Right Arm)   Pulse 85   Temp 97.7 ?F (36.5 ?C) (Oral)   Resp 18   SpO2 99%  ?Gen:   Awake, no distress   ?Resp:  Normal effort  ?MSK:   Moves extremities without difficulty  ?Other:  GU exam deferred in triage today.  No abdominal tenderness to palpation. ? ?Medical Decision Making  ?Medically screening exam initiated at 12:42 PM.  Appropriate orders placed.  Ann [name] was informed that the remainder of the evaluation will be completed by another provider, this initial triage assessment does not replace that evaluation, and the importance of remaining in the ED until their evaluation is complete. ? ?  ?Miciah Shealy A, PA-C ?02/12/22 1248 ? ?

## 2022-02-12 NOTE — ED Notes (Signed)
Patient complains of having a Bartholin cyst, hard to sit, unable to sleep due to pain.  She states that she noticed it on Thursday.   ?

## 2022-02-12 NOTE — ED Triage Notes (Signed)
Patient complains of a painful cyst on the right side of vulva, occurred previously and and made an appointment with her gynecologist but it resolved before her appointment. Pain returned three days ago. Patient is alert, oriented, and in no apparent distress at this time. ?

## 2022-02-12 NOTE — ED Provider Notes (Signed)
?[facility] ?Provider Note ? ? ?CSN: [id number] ?Arrival date & time: 02/12/22  1133 ? ?  ? ?History ? ?Chief Complaint  ?Patient presents with  ? Bartholin's Cyst  ? ? ?Ann [name] is a 26 y.o. female who presents with concern for Bartholin's gland cyst diagnosed by PCP on the right side which has been causing her pain for the last 4 days.  Patient has had same diagnosed in the past but it improved with sitz bath's and warm compresses therefore she did not follow-up with her gynecologist.  Recurred a few days ago and she cannot get into see her OB/GYN for greater than 1 month. ? ?I personally reviewed this patient's medical records.  She has history of HSV, hidradenitis axillaris, STI with trichomonas.  She is on Valtrex daily.  She been using Tylenol without improvement in her pain. ?HPI ? ?  ? ?Home Medications ?Prior to Admission medications   ?Medication Sig Start Date End Date Taking? Authorizing Provider  ?sulfamethoxazole-trimethoprim (BACTRIM DS) 800-160 MG tablet Take 1 tablet by mouth 2 (two) times daily for 7 days. 02/12/22 02/19/22 Yes Henli Hey, Lupe Carneyebekah R, PA-C  ?cetirizine (ZYRTEC) 10 MG tablet Take 1 tablet (10 mg total) by mouth daily. 03/08/21   Littie DeedsSun, Richard, MD  ?chlorhexidine (HIBICLENS) 4 % external liquid Apply topically daily as needed. ?Patient not taking: No sig reported 04/28/21   Westley ChandlerBrown, Carina M, MD  ?clindamycin (CLEOCIN-T) 1 % external solution Apply topically 2 (two) times daily. When abscesses flare 01/12/22   Jackelyn PolingWelborn, Ryan, DO  ?ELDERBERRY PO Take by mouth.    [provider]  ?metroNIDAZOLE (METROGEL VAGINAL) 0.75 % vaginal gel Dose vaginally at bedtime for 5 days 12/18/21   Shirlean MylarMahoney, Caitlin, MD  ?naproxen (NAPROSYN) 500 MG tablet TAKE 1 TABLET(500 MG) BY MOUTH TWICE DAILY 10/13/21   Littie DeedsSun, Richard, MD  ?valACYclovir (VALTREX) 1000 MG tablet Take 1 tablet (1,000 mg total) by mouth daily. 01/12/22   Jackelyn PolingWelborn, Ryan, DO  ?   ? ?Allergies     ?Pollen extract   ? ?Review of Systems   ?Review of Systems  ?Constitutional: Negative.   ?HENT: Negative.    ?Eyes: Negative.   ?Respiratory: Negative.    ?Cardiovascular: Negative.   ?Gastrointestinal: Negative.   ?Genitourinary:  Positive for vaginal pain. Negative for decreased urine volume, pelvic pain, urgency, vaginal bleeding and vaginal discharge.  ?Musculoskeletal: Negative.   ?Neurological: Negative.   ? ?Physical Exam ?Updated Vital Signs ?BP 127/83   Pulse 81   Temp 98.3 ?F (36.8 ?C)   Resp 16   SpO2 100%  ?Physical Exam ?Vitals and nursing note reviewed. Exam conducted with a chaperone present.  ?Constitutional:   ?   Appearance: She is obese. She is not ill-appearing or toxic-appearing.  ?HENT:  ?   Head: Normocephalic and atraumatic.  ?   Nose: Nose normal.  ?Eyes:  ?   General: No scleral icterus.    ?   Right eye: No discharge.     ?   Left eye: No discharge.  ?   Extraocular Movements: Extraocular movements intact.  ?   Conjunctiva/sclera: Conjunctivae normal.  ?   Pupils: Pupils are equal, round, and reactive to light.  ?Pulmonary:  ?   Effort: Pulmonary effort is normal.  ?Abdominal:  ?   General: There is no distension.  ?   Palpations: Abdomen is soft.  ?   Tenderness: There is no abdominal tenderness. There is no right CVA  tenderness, guarding or rebound.  ?Genitourinary: ? ? ?Skin: ?   General: Skin is warm and dry.  ?   Capillary Refill: Capillary refill takes less than 2 seconds.  ?   Findings: Abscess present.  ?Neurological:  ?   General: No focal deficit present.  ?   Mental Status: She is alert and oriented to person, place, and time.  ?Psychiatric:     ?   Mood and Affect: Mood normal.  ? ? ?ED Results / Procedures / Treatments   ?Labs ?(all labs ordered are listed, but only abnormal results are displayed) ?Labs Reviewed - No data to display ? ?EKG ?None ? ?Radiology ?No results found. ? ?Procedures ?Marland Kitchen.Incision and Drainage ? ?Date/Time: 02/12/2022 9:28 PM ?Performed by:  Paris Lore, PA-C ?Authorized by: Paris Lore, PA-C  ? ?Consent:  ?  Consent obtained:  Verbal ?  Consent given by:  Patient ?  Risks discussed:  Bleeding, incomplete drainage, pain and damage to other organs ?  Alternatives discussed:  No treatment ?Universal protocol:  ?  Procedure explained and questions answered to patient or proxy's satisfaction: yes   ?  Relevant documents present and verified: yes   ?  Test results available : yes   ?  Imaging studies available: yes   ?  Required blood products, implants, devices, and special equipment available: yes   ?  Site/side marked: yes   ?  Immediately prior to procedure, a time out was called: yes   ?  Patient identity confirmed:  Verbally with patient ?Location:  ?  Type:  Bartholin cyst ?  Size:  5 ?  Location:  Anogenital ?  Anogenital location:  Bartholin's gland ?Pre-procedure details:  ?  Skin preparation:  Betadine ?Sedation:  ?  Sedation type:  None ?Anesthesia:  ?  Anesthesia method:  Local infiltration ?  Local anesthetic:  Lidocaine 2% WITH epi ?Procedure type:  ?  Complexity:  Simple ?Procedure details:  ?  Incision type: Spontaneous rupture of the abscess during instillation of lidocaine for local anesthetic.  Spontaneous rupture with hole of 0.5 cm length. ?  Wound management:  Irrigated with saline and extensive cleaning ?  Drainage:  Purulent ?  Drainage amount:  Copious ?  Packing materials:  None (Patient declined packing) ?Post-procedure details:  ?  Procedure completion:  Tolerated well, no immediate complications  ? ? ?Medications Ordered in ED ?Medications  ?sulfamethoxazole-trimethoprim (BACTRIM DS) 800-160 MG per tablet 1 tablet (has no administration in time range)  ?lidocaine-EPINEPHrine (XYLOCAINE W/EPI) 2 %-1:200000 (PF) injection 10 mL (10 mLs Intradermal Given 02/12/22 2053)  ?oxyCODONE-acetaminophen (PERCOCET/ROXICET) 5-325 MG per tablet 1 tablet (1 tablet Oral Given 02/12/22 2055)  ? ? ?ED Course/ Medical Decision  Making/ A&P ?  ?                        ?Medical Decision Making ?25 year old female with Bartholin gland abscess diagnosed in the outpatient setting presents with recurrence and with worsening pain.  No sign systemic symptoms. ? ?Vital signs are normal and intake.  Cardiopulmonary exam is normal, abdominal with benign.  GU exam as above with concern for large Bartholin gland abscess, which was subsequently drained successfully in the emergency department.. ? ? ?Risk ?Prescription drug management. ? ? ?Recommend sitz bath's in the outpatient setting, close OB/GYN follow-up.  Antibiotics prescribed. ? ?No further work-up warranted in the emergency department this time. Shanora voiced understanding of her medical evaluation and  treatment plan.  Each of her questions was answered to her expressed satisfaction.  Return precautions were given.  Patient is well-appearing, stable, and was discharged in good condition . ? ?This chart was dictated using voice recognition software, Dragon. Despite the best efforts of this provider to proofread and correct errors, errors may still occur which can change documentation meaning. ? ?Final Clinical Impression(s) / ED Diagnoses ?Final diagnoses:  ?Bartholin's gland abscess  ? ? ?Rx / DC Orders ?ED Discharge Orders   ? ?      Ordered  ?  sulfamethoxazole-trimethoprim (BACTRIM DS) 800-160 MG tablet  2 times daily       ? 02/12/22 2116  ? ?  ?  ? ?  ? ? ?  ?Paris Lore, PA-C ?02/12/22 2132 ? ?  ?Wynetta Fines, MD ?02/13/22 0009 ? ?

## 2022-02-12 NOTE — Discharge Instructions (Addendum)
You were seen here today for your Bartholin abscess.  This was successfully drained in the emergency department.  We prescribed antibiotics to take outpatient twice a day for the next week.  Please call your OB/GYN and informed them to be seen for emergency room follow-up in the next week.  You may use Tylenol and Motrin as needed for the pain.  Please use sitz bath's at home and staying from vaginal intercourse for the next 2 to 4 weeks. ? ?Follow-up with your OB/GYN and return to the ER with any new severe symptoms. ?

## 2022-05-01 ENCOUNTER — Encounter: Payer: Self-pay | Admitting: *Deleted

## 2022-05-16 NOTE — Patient Instructions (Addendum)
It was wonderful to see you today.  Today we talked about:  --We are checking for sexually transmitted infections including chlamydia, gonorrhea, trichomonas, HIV, syphilis and hepatitis B. I will let you know of the results via MyChart or telephone call. We are also checking for bacterial vaginosis and yeast, which are not sexually transmitted infections.  -You should abstain from sexual activity until we have the results. If your test is positive for a sexually transmitted infection, it is important that both you and your partner are both treated.  -It is always important to use barrier protection, such as condoms, to help prevent sexually transmitted infections.   -We also did your Pap smear today. If normal, your next will be due in 3 years. -I have refilled your Zyrtec and Valtrex.   Thank you for choosing [facility].   Please call [phone] with any questions about today's appointment.  Please be sure to schedule follow up at the front  desk before you leave today.   Sabino Dick, DO PGY-2 Family Medicine

## 2022-05-16 NOTE — Progress Notes (Signed)
    SUBJECTIVE:   CHIEF COMPLAINT / HPI:   Vaginal Discharge: Patient is a 26 y.o. female presenting for STI testing.  States that she has a new partner which she has been sexually active.  She denies any abnormal vaginal discharge, does still have an abnormal odor which she thinks may be BV.  She is using a natural supplement for contraception.  States that she has had adverse reactions to Nexplanon and OCPs in the past. Would also like a pregnancy   PERTINENT  PMH / PSH: None relevant  OBJECTIVE:   BP 110/70   Pulse 78   Ht 5\' 9"  (1.753 m)   Wt 258 lb (117 kg)   LMP 04/26/2022   SpO2 98%   BMI 38.10 kg/m    General: NAD, pleasant, able to participate in exam Respiratory: Normal effort, no obvious respiratory distress Pelvic: VULVA: normal appearing vulva with no masses, tenderness or lesions, VAGINA: Normal appearing vagina with normal color, no lesions, with scant, white, and thick discharge present CERVIX: No lesions, scant and clear discharge present, friable  Chaperone Jone Baseman, CMA present for pelvic exam  ASSESSMENT/PLAN:   Encounter for screening examination for sexually transmitted disease Assessment:  26 y.o. female with here for routine STI check.  Physical exam significant for scant white and clumpy discharge.  Patient is interested in full STI screening.   Plan: -Wet prep shows BV, Flagyl sent to pharmacy. -GC/chlamydia vaginal and anal swabs -Will check HIV, Hep B and RPR -Contraception: Discussed that natural supplements are not generally regulated and may not be effective in preventing pregancy. Discussed importance of condoms in protecting against STI. Provided with condoms today. -Pap smear performed today as patient due  Encounter for pregnancy test Performed today per patients request. She gets normal monthly cycles, last one was earlier this month. States per her ovulation calender she is due to have another at the end of the month. Test resulted  negative.    Sabino Dick, DO [facility]

## 2022-05-18 ENCOUNTER — Ambulatory Visit (INDEPENDENT_AMBULATORY_CARE_PROVIDER_SITE_OTHER): Payer: Self-pay | Admitting: Family Medicine

## 2022-05-18 ENCOUNTER — Other Ambulatory Visit (HOSPITAL_COMMUNITY)
Admission: RE | Admit: 2022-05-18 | Discharge: 2022-05-18 | Disposition: A | Discharge status: Home / Self Care | Payer: Medicaid Other | Source: Ambulatory Visit | Attending: Family Medicine | Admitting: Family Medicine

## 2022-05-18 ENCOUNTER — Encounter: Payer: Self-pay | Admitting: Family Medicine

## 2022-05-18 VITALS — BP 110/70 | HR 78 | Ht 69.0 in | Wt 258.0 lb

## 2022-05-18 DIAGNOSIS — N760 Acute vaginitis: Secondary | ICD-10-CM

## 2022-05-18 DIAGNOSIS — Z3200 Encounter for pregnancy test, result unknown: Secondary | ICD-10-CM | POA: Insufficient documentation

## 2022-05-18 DIAGNOSIS — Z113 Encounter for screening for infections with a predominantly sexual mode of transmission: Secondary | ICD-10-CM | POA: Insufficient documentation

## 2022-05-18 DIAGNOSIS — Z124 Encounter for screening for malignant neoplasm of cervix: Secondary | ICD-10-CM | POA: Insufficient documentation

## 2022-05-18 DIAGNOSIS — B9689 Other specified bacterial agents as the cause of diseases classified elsewhere: Secondary | ICD-10-CM

## 2022-05-18 LAB — POCT WET PREP (WET MOUNT)
Clue Cells Wet Prep Whiff POC: POSITIVE
Trichomonas Wet Prep HPF POC: ABSENT

## 2022-05-18 LAB — POCT URINE PREGNANCY: Preg Test, Ur: NEGATIVE

## 2022-05-18 MED ORDER — VALACYCLOVIR HCL 1 G PO TABS: 1000.0000 mg | ORAL_TABLET | Freq: Every day | ORAL | 1 refills | Status: DC

## 2022-05-18 MED ORDER — METRONIDAZOLE 500 MG PO TABS: 500.0000 mg | ORAL_TABLET | Freq: Two times a day (BID) | ORAL | 0 refills | Status: AC

## 2022-05-18 MED ORDER — CETIRIZINE HCL 10 MG PO TABS: 10.0000 mg | ORAL_TABLET | Freq: Every day | ORAL | 11 refills | Status: DC

## 2022-05-18 NOTE — Assessment & Plan Note (Addendum)
Performed today per patients request. She gets normal monthly cycles, last one was earlier this month. States per her ovulation calender she is due to have another at the end of the month. Test resulted negative.

## 2022-05-18 NOTE — Assessment & Plan Note (Addendum)
Assessment:  26 y.o. female with here for routine STI check.  Physical exam significant for scant white and clumpy discharge.  Patient is interested in full STI screening.   Plan: -Wet prep shows BV, Flagyl sent to pharmacy. -GC/chlamydia vaginal and anal swabs -Will check HIV, Hep B and RPR -Contraception: Discussed that natural supplements are not generally regulated and may not be effective in preventing pregancy. Discussed importance of condoms in protecting against STI. Provided with condoms today. -Pap smear performed today as patient due

## 2022-05-19 LAB — RPR: RPR Ser Ql: NONREACTIVE

## 2022-05-19 LAB — HIV ANTIBODY (ROUTINE TESTING W REFLEX): HIV Screen 4th Generation wRfx: NONREACTIVE

## 2022-05-19 LAB — HEPATITIS B SURFACE ANTIGEN: Hepatitis B Surface Ag: NEGATIVE

## 2022-05-21 LAB — CERVICOVAGINAL ANCILLARY ONLY
Chlamydia: NEGATIVE
Chlamydia: NEGATIVE
Comment: NEGATIVE
Comment: NEGATIVE
Comment: NORMAL
Comment: NORMAL
Neisseria Gonorrhea: NEGATIVE
Neisseria Gonorrhea: NEGATIVE

## 2022-05-22 LAB — CYTOLOGY - PAP
Diagnosis: NEGATIVE
Diagnosis: REACTIVE

## 2022-06-07 ENCOUNTER — Ambulatory Visit (HOSPITAL_COMMUNITY)
Admission: EM | Admit: 2022-06-07 | Discharge: 2022-06-07 | Disposition: A | Discharge status: Home / Self Care | Payer: Medicaid Other | Attending: Internal Medicine | Admitting: Internal Medicine

## 2022-06-07 ENCOUNTER — Encounter (HOSPITAL_COMMUNITY): Payer: Self-pay | Admitting: Emergency Medicine

## 2022-06-07 DIAGNOSIS — N758 Other diseases of Bartholin's gland: Secondary | ICD-10-CM

## 2022-06-07 MED ORDER — AMOXICILLIN-POT CLAVULANATE 875-125 MG PO TABS
1.0000 | ORAL_TABLET | Freq: Two times a day (BID) | ORAL | 0 refills | Status: DC
Start: 2022-06-07 — End: 2022-08-10

## 2022-06-07 NOTE — Discharge Instructions (Signed)
Take medications as prescribed Continue sitz bath's 2-3 times a day If pain or swelling gets worse please go to the emergency room for evaluation. If you have recurrent Bartholin gland infections, please discuss with your GYN on your next appointment

## 2022-06-07 NOTE — ED Triage Notes (Signed)
Pt reports a vaginal abscess x 4 days. States this has happened in the past and the area had to be drained.

## 2022-06-07 NOTE — ED Provider Notes (Addendum)
[facility]    CSN: [id number] Arrival date & time: 06/07/22  1400      History   Chief Complaint Chief Complaint  Patient presents with   Abscess    HPI Ann [name] is a 26 y.o. female with a history of Bartholin cyst infection comes to the urgent care with a 4-day history of vaginal pain and swelling of the Bartholin gland.  Patient says symptoms started insidiously and has been progressive.  Pain is currently of moderate severity.  Pain is throbbing, aggravated by palpation and no known relieving factors.  Patient denies any fever or chills.  No vaginal discharge.  No dysuria urgency or frequency.  Patient has done sitz bath's only once over the past few days  Patient was recently treated for Bartholin gland abscess.  She underwent incision and drainage in the emergency department. HPI  Past Medical History:  Diagnosis Date   Possible exposure to STD 03/26/2019    Patient Active Problem List   Diagnosis Date Noted   Encounter for screening examination for sexually transmitted disease 05/18/2022   Encounter for pregnancy test 05/18/2022   Candida vaginitis 11/01/2021   Prediabetes 11/01/2021   Sebaceous cyst of breast 10/15/2021   Chronic upper back pain 10/15/2021   Hidradenitis axillaris 07/27/2021   Trichomonas vaginitis 07/27/2021   Boil, axilla 06/23/2021   Positive test for herpes simplex virus (HSV) antibody 08/31/2020   Contraception management 05/20/2019   Screen for STD (sexually transmitted disease) 08/09/2016   Obesity (BMI 35.0-39.9 without comorbidity) 05/06/2009   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 No pertinent surgical history.  OB History     Gravida  0   Para  0   Term  0   Preterm  0   AB  0   Living  0      SAB  0   IAB  0   Ectopic  0   Multiple  0   Live Births  0            Home Medications    Prior to Admission medications   Medication  Sig Start Date End Date Taking? Authorizing Provider  amoxicillin-clavulanate (AUGMENTIN) 875-125 MG tablet Take 1 tablet by mouth every 12 (twelve) hours. 06/07/22  Yes Janann Boeve, Britta Mccreedy, MD  cetirizine (ZYRTEC) 10 MG tablet Take 1 tablet (10 mg total) by mouth daily. 05/18/22   Sabino Dick, DO  clindamycin (CLEOCIN-T) 1 % external solution Apply topically 2 (two) times daily. When abscesses flare 01/12/22   Jackelyn Poling, DO  naproxen (NAPROSYN) 500 MG tablet TAKE 1 TABLET(500 MG) BY MOUTH TWICE DAILY 10/13/21   Littie Deeds, MD  valACYclovir (VALTREX) 1000 MG tablet Take 1 tablet (1,000 mg total) by mouth daily. Take 1 tablet daily for 10 days at the start of an outbreak. 05/18/22   Sabino Dick, DO    Family History Family History  Problem Relation Age of Onset   Diabetes Maternal Uncle    Cancer Maternal Grandmother    Hypertension Maternal Grandmother    Heart disease Neg Hx     Social History Social History   Tobacco Use   Smoking status: Some Days    Types: E-cigarettes   Smokeless tobacco: Never  Substance Use Topics   Alcohol use: No   Drug use: No     Allergies   Pollen extract   Review of Systems Review of Systems  Gastrointestinal: Negative.  Genitourinary:  Positive for vaginal pain. Negative for dysuria, frequency, pelvic pain, urgency, vaginal bleeding and vaginal discharge.  Musculoskeletal: Negative.      Physical Exam Triage Vital Signs ED Triage Vitals  Enc Vitals Group     BP --      Pulse Rate 06/07/22 1539 66     Resp 06/07/22 1539 16     Temp 06/07/22 1539 98.2 F (36.8 C)     Temp Source 06/07/22 1539 Oral     SpO2 06/07/22 1539 98 %     Weight --      Height --      Head Circumference --      Peak Flow --      Pain Score 06/07/22 1538 9     Pain Loc --      Pain Edu? --      Excl. in GC? --    No data found.  Updated Vital Signs Pulse 66   Temp 98.2 F (36.8 C) (Oral)   Resp 16   LMP 04/26/2022   SpO2 98%    Visual Acuity Right Eye Distance:   Left Eye Distance:   Bilateral Distance:    Right Eye Near:   Left Eye Near:    Bilateral Near:     Physical Exam Vitals and nursing note reviewed. Exam conducted with a chaperone present.  Constitutional:      General: She is not in acute distress.    Appearance: She is not ill-appearing.  Cardiovascular:     Rate and Rhythm: Normal rate and regular rhythm.  Pulmonary:     Effort: Pulmonary effort is normal.     Breath sounds: Normal breath sounds.  Abdominal:     General: Bowel sounds are normal.     Palpations: Abdomen is soft.  Genitourinary:    Comments: Right Bartholin gland swelling.  Swelling is tender to touch.  No discharge.  No vaginal discharge. Neurological:     Mental Status: She is alert.      UC Treatments / Results  Labs (all labs ordered are listed, but only abnormal results are displayed) Labs Reviewed - No data to display  EKG   Radiology No results found.  Procedures Procedures (including critical care time)  Medications Ordered in UC Medications - No data to display  Initial Impression / Assessment and Plan / UC Course  I have reviewed the triage vital signs and the nursing notes.  Pertinent labs & imaging results that were available during my care of the patient were reviewed by me and considered in my medical decision making (see chart for details).     1.  Bartholinitis: Augmentin 875-125 1 tablet twice daily for 7 days Ibuprofen as needed for pain and/or fever. Patient is advised to do sitz bath's 2-3 times a day If pain worsens patient is advised to go to the emergency department to have further evaluation for possible incision and drainage Return precautions given Final Clinical Impressions(s) / UC Diagnoses   Final diagnoses:  Bartholinitis     Discharge Instructions      Take medications as prescribed Continue sitz bath's 2-3 times a day If pain or swelling gets worse please  go to the emergency room for evaluation. If you have recurrent Bartholin gland infections, please discuss with your GYN on your next appointment   ED Prescriptions     Medication Sig Dispense Auth. Provider   amoxicillin-clavulanate (AUGMENTIN) 875-125 MG tablet Take 1 tablet by mouth every  12 (twelve) hours. 14 tablet Aunisty Reali, Britta Mccreedy, MD      PDMP not reviewed this encounter.   Merrilee Jansky, MD 06/07/22 1617    Merrilee Jansky, MD 06/07/22 [phone]

## 2022-08-10 ENCOUNTER — Ambulatory Visit (INDEPENDENT_AMBULATORY_CARE_PROVIDER_SITE_OTHER): Payer: Self-pay | Admitting: Family Medicine

## 2022-08-10 ENCOUNTER — Encounter: Payer: Self-pay | Admitting: Family Medicine

## 2022-08-10 ENCOUNTER — Other Ambulatory Visit (HOSPITAL_COMMUNITY)
Admission: RE | Admit: 2022-08-10 | Discharge: 2022-08-10 | Disposition: A | Discharge status: Home / Self Care | Payer: Medicaid Other | Source: Ambulatory Visit | Attending: Family Medicine | Admitting: Family Medicine

## 2022-08-10 VITALS — BP 115/78 | HR 77 | Ht 69.0 in | Wt 270.2 lb

## 2022-08-10 DIAGNOSIS — R7303 Prediabetes: Secondary | ICD-10-CM

## 2022-08-10 DIAGNOSIS — Z3200 Encounter for pregnancy test, result unknown: Secondary | ICD-10-CM

## 2022-08-10 DIAGNOSIS — Z0000 Encounter for general adult medical examination without abnormal findings: Secondary | ICD-10-CM

## 2022-08-10 DIAGNOSIS — Z113 Encounter for screening for infections with a predominantly sexual mode of transmission: Secondary | ICD-10-CM | POA: Insufficient documentation

## 2022-08-10 DIAGNOSIS — N898 Other specified noninflammatory disorders of vagina: Secondary | ICD-10-CM

## 2022-08-10 LAB — POCT WET PREP (WET MOUNT)
Clue Cells Wet Prep Whiff POC: POSITIVE
Trichomonas Wet Prep HPF POC: ABSENT

## 2022-08-10 LAB — POCT URINE PREGNANCY: Preg Test, Ur: NEGATIVE

## 2022-08-10 MED ORDER — NAPROXEN 500 MG PO TABS: 500.0000 mg | ORAL_TABLET | Freq: Two times a day (BID) | ORAL | 0 refills | Status: DC | PRN

## 2022-08-10 MED ORDER — VALACYCLOVIR HCL 1 G PO TABS: 1000.0000 mg | ORAL_TABLET | Freq: Every day | ORAL | 1 refills | Status: DC

## 2022-08-10 MED ORDER — METRONIDAZOLE 500 MG PO TABS: 500.0000 mg | ORAL_TABLET | Freq: Two times a day (BID) | ORAL | 0 refills | Status: AC

## 2022-08-10 NOTE — Patient Instructions (Addendum)
It was nice seeing you today!  Keep working on exercising and trying to eat healthier.  Come back and see me in the next 3 months if you would like to do a visit focused on weight loss and nutrition.  Stay well, Littie Deeds, MD [facility] [phone]  --  Make sure to check out at the front desk before you leave today.  Please arrive at least 15 minutes prior to your scheduled appointments.  If you had blood work today, I will send you a MyChart message or a letter if results are normal. Otherwise, I will give you a call.  If you had a referral placed, they will call you to set up an appointment. Please give [place] a call if you don't hear back in the next 2 weeks.  If you need additional refills before your next appointment, please call your pharmacy first.

## 2022-08-10 NOTE — Progress Notes (Signed)
    SUBJECTIVE:   CHIEF COMPLAINT / HPI:  Chief Complaint  Patient presents with   sti screening    Recent BV infection June 2023, treated.  Reports vaginal irritation ongoing for about 1 week.  Denies vaginal discharge.  Wants to have wet prep done and STI screening.  Declines blood test for HIV and syphilis.    Uses condoms intermittently for contraception.  Also takes Neema leaf for contraception.  Previously had hives with Nexplanon. OCP caused prolonged bleeding though only was on it for 2 months.  Active at work. Does some weights at home. Plans to go back to the gym soon.  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  Form is sent via MyChart.  Needs refill on naproxen which she takes occasionally for back pain.  Also needs refill on valacyclovir for herpes outbreaks.  Last outbreak was about 1 year ago.  PERTINENT  PMH / PSH: HSV, hidradenitis suppurativa, prediabetes  Patient Care Team: Littie Deeds, MD as PCP - General (Family Medicine)   OBJECTIVE:   BP 115/78   Pulse 77   Ht 5\' 9"  (1.753 m)   Wt 270 lb 4 oz (122.6 kg)   LMP 07/24/2022   SpO2 100%   BMI 39.91 kg/m   Physical Exam Exam conducted with a chaperone present.  Constitutional:      General: She is not in acute distress.    Appearance: She is obese.  Cardiovascular:     Rate and Rhythm: Normal rate and regular rhythm.  Pulmonary:     Effort: Pulmonary effort is normal. No respiratory distress.     Breath sounds: Normal breath sounds.  Genitourinary:    Vagina: Vaginal discharge present.     Comments: Left labia with Bartholin cyst noted.  White discharge noted in the vaginal vault.  Cervix appears normal Musculoskeletal:     Cervical back: Neck supple.  Neurological:     Mental Status: She is alert.         08/10/2022    3:16 PM  Depression screen PHQ 2/9  Decreased Interest 0  Down, Depressed, Hopeless 0  PHQ - 2 Score 0  Altered sleeping 0  Tired,  decreased energy 0  Change in appetite 0  Feeling bad or failure about yourself  0  Trouble concentrating 0  Moving slowly or fidgety/restless 0  Suicidal thoughts 0  PHQ-9 Score 0  Difficult doing work/chores Not difficult at all     {Show previous vital signs (optional):23777}    ASSESSMENT/PLAN:   Vaginal irritation - wet prep positive for BV - metronidazole - GC/chlamydia - HIV/RPR declined - contraception counseling provided  Prediabetes Counseling provided, info provided on Mediterranean diet.  Advised to increase exercise.  Plan to recheck A1c in 3 months.  Offered appointment to further discuss nutrition and weight loss.   Work form filled out for patient  Return in about 3 months (around 11/09/2022) for f/u weight, prediabetes.   11/11/2022, MD [facility]

## 2022-08-10 NOTE — Assessment & Plan Note (Signed)
Counseling provided, info provided on Mediterranean diet.  Advised to increase exercise.  Plan to recheck A1c in 3 months.  Offered appointment to further discuss nutrition and weight loss.

## 2022-08-13 LAB — CERVICOVAGINAL ANCILLARY ONLY
Chlamydia: NEGATIVE
Comment: NEGATIVE
Comment: NEGATIVE
Comment: NORMAL
Neisseria Gonorrhea: NEGATIVE
Trichomonas: NEGATIVE

## 2022-10-09 ENCOUNTER — Encounter: Payer: Self-pay | Admitting: Family Medicine

## 2022-10-09 ENCOUNTER — Ambulatory Visit: Payer: Medicaid Other | Admitting: Family Medicine

## 2022-10-09 NOTE — Patient Instructions (Incomplete)
It was nice seeing you today!  Blood work today.  See me in 3 months or whenever is a good for you.  Stay well, Melissaann Dizdarevic, MD  [facility] [phone]  --  Make sure to check out at the front desk before you leave today.  Please arrive at least 15 minutes prior to your scheduled appointments.  If you had blood work today, I will send you a MyChart message or a letter if results are normal. Otherwise, I will give you a call.  If you had a referral placed, they will call you to set up an appointment. Please give us a call if you don't hear back in the next 2 weeks.  If you need additional refills before your next appointment, please call your pharmacy first.  

## 2022-10-09 NOTE — Progress Notes (Deleted)
    SUBJECTIVE:   CHIEF COMPLAINT / HPI:  No chief complaint on file.   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  Physical Exam      08/10/2022    3:16 PM  Depression screen PHQ 2/9  Decreased Interest 0  Down, Depressed, Hopeless 0  PHQ - 2 Score 0  Altered sleeping 0  Tired, decreased energy 0  Change in appetite 0  Feeling bad or failure about yourself  0  Trouble concentrating 0  Moving slowly or fidgety/restless 0  Suicidal thoughts 0  PHQ-9 Score 0  Difficult doing work/chores Not difficult at all     {Show previous vital signs (optional):23777}  {Labs  Heme  Chem  Endocrine  Serology  Results Review (optional):23779}  ASSESSMENT/PLAN:   No problem-specific Assessment & Plan notes found for this encounter.    No follow-ups on file.   Littie Deeds, MD [facility]

## 2022-10-10 ENCOUNTER — Ambulatory Visit: Payer: Medicaid Other | Admitting: Family Medicine

## 2022-10-11 ENCOUNTER — Ambulatory Visit (INDEPENDENT_AMBULATORY_CARE_PROVIDER_SITE_OTHER): Payer: Self-pay | Admitting: Student

## 2022-10-11 VITALS — BP 120/75 | HR 94 | Wt 265.8 lb

## 2022-10-11 DIAGNOSIS — R894 Abnormal immunological findings in specimens from other organs, systems and tissues: Secondary | ICD-10-CM

## 2022-10-11 DIAGNOSIS — N750 Cyst of Bartholin's gland: Secondary | ICD-10-CM

## 2022-10-11 DIAGNOSIS — N760 Acute vaginitis: Secondary | ICD-10-CM

## 2022-10-11 DIAGNOSIS — B9689 Other specified bacterial agents as the cause of diseases classified elsewhere: Secondary | ICD-10-CM

## 2022-10-11 DIAGNOSIS — N898 Other specified noninflammatory disorders of vagina: Secondary | ICD-10-CM

## 2022-10-11 LAB — POCT WET PREP (WET MOUNT)
Clue Cells Wet Prep Whiff POC: POSITIVE
Trichomonas Wet Prep HPF POC: ABSENT

## 2022-10-11 MED ORDER — VALACYCLOVIR HCL 1 G PO TABS: 1000.0000 mg | ORAL_TABLET | Freq: Every day | ORAL | 1 refills | Status: DC

## 2022-10-11 MED ORDER — METRONIDAZOLE 500 MG PO TABS: 500.0000 mg | ORAL_TABLET | Freq: Two times a day (BID) | ORAL | 0 refills | Status: DC

## 2022-10-11 MED ORDER — NAPROXEN 500 MG PO TABS: 500.0000 mg | ORAL_TABLET | Freq: Two times a day (BID) | ORAL | 0 refills | Status: DC | PRN

## 2022-10-11 NOTE — Patient Instructions (Addendum)
We drained your cyst. It will probably come back. Let's get you to the gynecologist for definitive treatment. They will call you to set up an appointment. Wear a pad for the next few days as you may have some bleeding.  Dorothyann Gibbs, MD

## 2022-10-12 DIAGNOSIS — N750 Cyst of Bartholin's gland: Secondary | ICD-10-CM | POA: Insufficient documentation

## 2022-10-12 NOTE — Progress Notes (Signed)
    SUBJECTIVE:   CHIEF COMPLAINT / HPI:   Bartholin Gland Cyst Known history of Bartholin gland cyst bilaterally. Has been drained in the past. Right side is spontaneously draining but Left is bulging and painful. Patient is requesting drainage. Also expresses that she knows to expect recurrence and that definitive therapy will require OB/Gyn referral. She is amenable.  Would also like a Wet Prep to evaluate for BV. Reports that she often gets BV after her cysts spontaneously drain.   OBJECTIVE:   BP 120/75   Pulse 94   Wt 265 lb 12.8 oz (120.6 kg)   SpO2 100%   BMI 39.25 kg/m   Physical Exam Vitals reviewed. Exam conducted with a chaperone present.  Constitutional:      General: She is not in acute distress. Genitourinary:    Exam position: Lithotomy position.     Vagina: Vaginal discharge (clumping white) present.     Comments: Spontaneously draining Bartholin's cyst on left, bulging and tender Bartholin's cyst on right Skin:    General: Skin is warm and dry.      ASSESSMENT/PLAN:   Bartholin gland cyst After informed consent obtained, area prepped with alcohol swabs and area of cyst numbed using 2% lidocaine with epinephrine. The area was then prepped with Betadine solution and a small incision made with a #11 scalpel. Copious bloody fluid expressed. Post-procedural care reviewed. - Referral to Gyn for definitive therapy  Bacterial vaginosis Wet prep c/w BV. Will treat with PO Flagyl x7 days     Dorothyann Gibbs, MD [facility]

## 2022-10-12 NOTE — Assessment & Plan Note (Signed)
Wet prep c/w BV. Will treat with PO Flagyl x7 days

## 2022-10-12 NOTE — Assessment & Plan Note (Signed)
After informed consent obtained, area prepped with alcohol swabs and area of cyst numbed using 2% lidocaine with epinephrine. The area was then prepped with Betadine solution and a small incision made with a #11 scalpel. Copious bloody fluid expressed. Post-procedural care reviewed. - Referral to Gyn for definitive therapy

## 2022-11-29 ENCOUNTER — Ambulatory Visit: Payer: Medicaid Other

## 2022-12-03 ENCOUNTER — Ambulatory Visit: Payer: Medicaid Other

## 2022-12-03 NOTE — Progress Notes (Deleted)
    SUBJECTIVE:   CHIEF COMPLAINT / HPI:   ***Stomach pain  PERTINENT  PMH / PSH: ***  OBJECTIVE:   There were no vitals taken for this visit.  ***  ASSESSMENT/PLAN:   No problem-specific Assessment & Plan notes found for this encounter.     Gerrit Heck, MD [place]

## 2023-01-04 ENCOUNTER — Ambulatory Visit: Payer: Medicaid Other | Admitting: Family Medicine

## 2023-01-04 NOTE — Progress Notes (Deleted)
    SUBJECTIVE:   CHIEF COMPLAINT / HPI:  No chief complaint on file.   ***  PERTINENT  PMH / PSH: ***  Patient Care Team: Zola Button, MD as PCP - General (Family Medicine)   OBJECTIVE:   There were no vitals taken for this visit.  Physical Exam      08/10/2022    3:16 PM  Depression screen PHQ 2/9  Decreased Interest 0  Down, Depressed, Hopeless 0  PHQ - 2 Score 0  Altered sleeping 0  Tired, decreased energy 0  Change in appetite 0  Feeling bad or failure about yourself  0  Trouble concentrating 0  Moving slowly or fidgety/restless 0  Suicidal thoughts 0  PHQ-9 Score 0  Difficult doing work/chores Not difficult at all     {Show previous vital signs (optional):23777}  {Labs  Heme  Chem  Endocrine  Serology  Results Review (optional):23779}  ASSESSMENT/PLAN:   No problem-specific Assessment & Plan notes found for this encounter.    No follow-ups on file.   Zola Button, MD [place]

## 2023-01-04 NOTE — Patient Instructions (Incomplete)
It was nice seeing you today!  Blood work today.  See me in 3 months or whenever is a good for you.  Stay well, Ragan Duhon, MD [facility] [phone]  --  Make sure to check out at the front desk before you leave today.  Please arrive at least 15 minutes prior to your scheduled appointments.  If you had blood work today, I will send you a MyChart message or a letter if results are normal. Otherwise, I will give you a call.  If you had a referral placed, they will call you to set up an appointment. Please give us a call if you don't hear back in the next 2 weeks.  If you need additional refills before your next appointment, please call your pharmacy first.  

## 2023-01-17 ENCOUNTER — Other Ambulatory Visit (HOSPITAL_COMMUNITY)
Admission: RE | Admit: 2023-01-17 | Discharge: 2023-01-17 | Disposition: A | Discharge status: Home / Self Care | Payer: Medicaid Other | Source: Ambulatory Visit | Attending: Family Medicine | Admitting: Family Medicine

## 2023-01-17 ENCOUNTER — Ambulatory Visit (INDEPENDENT_AMBULATORY_CARE_PROVIDER_SITE_OTHER): Payer: Medicaid Other | Admitting: Student

## 2023-01-17 ENCOUNTER — Encounter: Payer: Self-pay | Admitting: Student

## 2023-01-17 ENCOUNTER — Ambulatory Visit: Payer: Medicaid Other | Admitting: Student

## 2023-01-17 VITALS — BP 106/64 | HR 87 | Wt 275.0 lb

## 2023-01-17 DIAGNOSIS — Z113 Encounter for screening for infections with a predominantly sexual mode of transmission: Secondary | ICD-10-CM | POA: Diagnosis present

## 2023-01-17 DIAGNOSIS — R7303 Prediabetes: Secondary | ICD-10-CM

## 2023-01-17 LAB — POCT WET PREP (WET MOUNT)
Clue Cells Wet Prep Whiff POC: NEGATIVE
Trichomonas Wet Prep HPF POC: ABSENT

## 2023-01-17 LAB — POCT GLYCOSYLATED HEMOGLOBIN (HGB A1C): HbA1c, POC (prediabetic range): 6.1 % (ref 5.7–6.4)

## 2023-01-17 NOTE — Progress Notes (Signed)
    SUBJECTIVE:   CHIEF COMPLAINT / HPI:   27 y.o. female presents for routine STD testing.  She is sexually active with one partner and inconsistent with condom use. Denies vaginal discharge, itchiness or odor.  No hematuria or recent change in medication.   PERTINENT  PMH / PSH: Reviewed   OBJECTIVE:   BP 106/64   Pulse 87   Wt 275 lb (124.7 kg)   LMP 01/17/2023   SpO2 98%   BMI 40.61 kg/m     Physical Exam General: Alert, well appearing, NAD Cardiovascular: RRR, well-perfused Respiratory: Normal work of breathing on room air Pelvic exam:  Normal introitus for age, no external lesions, mild blood vaginal discharge from menses,  mucosa pink and moist, no vaginal or cervical lesions, no vaginal atrophy, no friaility or hemorrhage, normal uterus size and position   RN Ok Edwards served as Chaperon for Pelvic exam  ASSESSMENT/PLAN:   Prediabetes Last A1c was 6.1 about 2 years ago.  Obtained lab for A1c.   STD Screening Sexually active patient who is inconsistent with condom use presents for STD testing.  Currently asymptomatic. -Obtain labs for chlamydia, gonorrhea, trichomonas, HIV and syphilis.  Alen Bleacher, MD [place]

## 2023-01-17 NOTE — Patient Instructions (Signed)
It was wonderful to meet you today. Thank you for allowing me to be a part of your care. Below is a short summary of what we discussed at your visit today:  We collected blood for STD which include chlamydia, gonorrhea, trichomonas, HIV and syphilis.  We will follow-up with you if any of the results come back positive.  Please bring all of your medications to every appointment!  If you have any questions or concerns, please do not hesitate to contact us via phone or MyChart message.   Alen Bleacher, MD [facility]

## 2023-01-17 NOTE — Assessment & Plan Note (Signed)
Last A1c was 6.1 about 2 years ago.  Obtained lab for A1c.

## 2023-01-18 LAB — RPR: RPR Ser Ql: NONREACTIVE

## 2023-01-18 LAB — HIV ANTIBODY (ROUTINE TESTING W REFLEX): HIV Screen 4th Generation wRfx: NONREACTIVE

## 2023-01-21 LAB — CERVICOVAGINAL ANCILLARY ONLY
Chlamydia: NEGATIVE
Comment: NEGATIVE
Comment: NORMAL
Neisseria Gonorrhea: NEGATIVE

## 2023-01-23 ENCOUNTER — Encounter: Payer: Self-pay | Admitting: Obstetrics and Gynecology

## 2023-01-23 ENCOUNTER — Encounter: Payer: Medicaid Other | Admitting: Obstetrics and Gynecology

## 2023-01-24 NOTE — Progress Notes (Signed)
Patient did not keep her new GYN appointment for 01/23/2023.  Durene Romans MD Attending Center for [employer] farm [employer])

## 2023-03-05 NOTE — Progress Notes (Deleted)
    SUBJECTIVE:   CHIEF COMPLAINT / HPI:   Depression management - not currently on meds  PERTINENT  PMH / PSH: ***  OBJECTIVE:   There were no vitals taken for this visit. ***  General: NAD, pleasant, able to participate in exam Cardiac: RRR, no murmurs. Respiratory: CTAB, normal effort, No wheezes, rales or rhonchi Abdomen: Bowel sounds present, nontender, nondistended Extremities: no edema or cyanosis. Skin: warm and dry, no rashes noted Neuro: alert, no obvious focal deficits Psych: Normal affect and mood  ASSESSMENT/PLAN:   No problem-specific Assessment & Plan notes found for this encounter.     Dr. Elberta Fortis, DO [place] [facility]    {    This will disappear when note is signed, click to select method of visit    :1}

## 2023-03-07 ENCOUNTER — Ambulatory Visit: Payer: Medicaid Other | Admitting: Family Medicine

## 2023-03-12 ENCOUNTER — Ambulatory Visit: Payer: Medicaid Other | Admitting: Family Medicine

## 2023-03-15 ENCOUNTER — Ambulatory Visit: Payer: Medicaid Other | Admitting: Student

## 2023-03-15 NOTE — Progress Notes (Deleted)
  SUBJECTIVE:   CHIEF COMPLAINT / HPI:   Depression and wanting support animal  PERTINENT  PMH / PSH: ***  Past Medical History:  Diagnosis Date   Possible exposure to STD 03/26/2019   Trichomonas vaginitis 07/27/2021    Patient Care Team: Littie Deeds, MD as PCP - General (Family Medicine) OBJECTIVE:  There were no vitals taken for this visit. Physical Exam   ASSESSMENT/PLAN:  There are no diagnoses linked to this encounter. No follow-ups on file. Bess Kinds, MD 03/15/2023, 9:37 AM PGY-***, [facility] Family [facility] {    This will disappear when note is signed, click to select method of visit    :1}

## 2023-03-21 ENCOUNTER — Ambulatory Visit: Payer: Medicaid Other | Admitting: Student

## 2023-03-21 NOTE — Progress Notes (Deleted)
  SUBJECTIVE:   CHIEF COMPLAINT / HPI:   Help w/ depression/ emotional support animal  PERTINENT  PMH / PSH: ***  Past Medical History:  Diagnosis Date   Possible exposure to STD 03/26/2019   Trichomonas vaginitis 07/27/2021    Patient Care Team: Littie Deeds, MD as PCP - General (Family Medicine) OBJECTIVE:  There were no vitals taken for this visit. Physical Exam   ASSESSMENT/PLAN:  There are no diagnoses linked to this encounter. No follow-ups on file. Bess Kinds, MD 03/21/2023, 8:14 AM PGY-***, [facility] Family Medicine {    This will disappear when note is signed, click to select method of visit    :1}

## 2023-03-28 ENCOUNTER — Ambulatory Visit: Payer: Medicaid Other | Admitting: Student

## 2023-03-28 NOTE — Progress Notes (Deleted)
  SUBJECTIVE:   CHIEF COMPLAINT / HPI:   Depression and emotional support animal  PERTINENT  PMH / PSH: ***  Past Medical History:  Diagnosis Date   Possible exposure to STD 03/26/2019   Trichomonas vaginitis 07/27/2021    Patient Care Team: Littie Deeds, MD as PCP - General (Family Medicine) OBJECTIVE:  There were no vitals taken for this visit. Physical Exam   ASSESSMENT/PLAN:  There are no diagnoses linked to this encounter. No follow-ups on file. Bess Kinds, MD 03/28/2023, 6:51 AM PGY-***, [facility] {    This will disappear when note is signed, click to select method of visit    :1}

## 2023-03-29 ENCOUNTER — Ambulatory Visit: Payer: Medicaid Other | Admitting: Student

## 2023-04-04 ENCOUNTER — Ambulatory Visit: Payer: Medicaid Other | Admitting: Family Medicine

## 2023-04-04 NOTE — Progress Notes (Deleted)
    SUBJECTIVE:   CHIEF COMPLAINT / HPI: help with depression, emotional support dog  PHQ-9  PERTINENT  PMH / PSH: Eczema, Hidradenitis axillaris, Obesity  OBJECTIVE:   There were no vitals taken for this visit.  ***  ASSESSMENT/PLAN:   There are no diagnoses linked to this encounter. No follow-ups on file.  Celine Mans, MD [facility]

## 2023-05-16 ENCOUNTER — Encounter: Payer: Self-pay | Admitting: Family Medicine

## 2023-05-16 ENCOUNTER — Other Ambulatory Visit: Payer: Self-pay

## 2023-05-16 ENCOUNTER — Other Ambulatory Visit (HOSPITAL_COMMUNITY)
Admission: RE | Admit: 2023-05-16 | Discharge: 2023-05-16 | Disposition: A | Discharge status: Home / Self Care | Payer: Medicaid Other | Source: Ambulatory Visit | Attending: Family Medicine | Admitting: Family Medicine

## 2023-05-16 ENCOUNTER — Ambulatory Visit (INDEPENDENT_AMBULATORY_CARE_PROVIDER_SITE_OTHER): Payer: Medicaid Other | Admitting: Family Medicine

## 2023-05-16 VITALS — BP 108/64 | HR 79 | Ht 69.0 in | Wt 271.8 lb

## 2023-05-16 DIAGNOSIS — L409 Psoriasis, unspecified: Secondary | ICD-10-CM

## 2023-05-16 DIAGNOSIS — N898 Other specified noninflammatory disorders of vagina: Secondary | ICD-10-CM

## 2023-05-16 DIAGNOSIS — J069 Acute upper respiratory infection, unspecified: Secondary | ICD-10-CM | POA: Diagnosis not present

## 2023-05-16 DIAGNOSIS — F4323 Adjustment disorder with mixed anxiety and depressed mood: Secondary | ICD-10-CM | POA: Diagnosis not present

## 2023-05-16 LAB — POC SOFIA 2 FLU + SARS ANTIGEN FIA
Influenza A, POC: NEGATIVE
Influenza B, POC: NEGATIVE
SARS Coronavirus 2 Ag: NEGATIVE

## 2023-05-16 MED ORDER — TRIAMCINOLONE ACETONIDE 0.5 % EX OINT: 1.0000 | TOPICAL_OINTMENT | Freq: Two times a day (BID) | CUTANEOUS | 0 refills | Status: DC

## 2023-05-16 NOTE — Patient Instructions (Signed)
It was wonderful to see you today.  Please bring ALL of your medications with you to every visit.   Today we talked about:  Psoriasis - use the steroid cream that I prescribed. Someone will call you to schedule an appointment at the family medicine center skin clinic and look for a call from the derm office in about 3 weeks.   BV: I will let you know the test results   Mood- Please follow up in 2 months   Thank you for choosing [facility].   Please call [phone] with any questions about today's appointment.  Please be sure to schedule follow up at the front desk before you leave today.   Lockie Mola, MD  Family Medicine

## 2023-05-17 ENCOUNTER — Other Ambulatory Visit: Payer: Self-pay

## 2023-05-17 ENCOUNTER — Encounter (HOSPITAL_COMMUNITY): Payer: Self-pay | Admitting: Emergency Medicine

## 2023-05-17 ENCOUNTER — Ambulatory Visit: Payer: Medicaid Other

## 2023-05-17 ENCOUNTER — Ambulatory Visit (HOSPITAL_COMMUNITY)
Admission: EM | Admit: 2023-05-17 | Discharge: 2023-05-17 | Disposition: A | Discharge status: Home / Self Care | Payer: Medicaid Other | Attending: Internal Medicine | Admitting: Internal Medicine

## 2023-05-17 DIAGNOSIS — Z3202 Encounter for pregnancy test, result negative: Secondary | ICD-10-CM

## 2023-05-17 DIAGNOSIS — K529 Noninfective gastroenteritis and colitis, unspecified: Secondary | ICD-10-CM

## 2023-05-17 DIAGNOSIS — R1084 Generalized abdominal pain: Secondary | ICD-10-CM

## 2023-05-17 LAB — POCT URINALYSIS DIP (MANUAL ENTRY)
Glucose, UA: NEGATIVE mg/dL
Ketones, POC UA: NEGATIVE mg/dL
Leukocytes, UA: NEGATIVE
Nitrite, UA: NEGATIVE
Protein Ur, POC: 100 mg/dL — AB
Spec Grav, UA: 1.02 (ref 1.010–1.025)
Urobilinogen, UA: 1 E.U./dL
pH, UA: 7 (ref 5.0–8.0)

## 2023-05-17 LAB — POCT URINE PREGNANCY: Preg Test, Ur: NEGATIVE

## 2023-05-17 MED ORDER — ONDANSETRON 4 MG PO TBDP
ORAL_TABLET | ORAL | Status: AC
  Filled 2023-05-17: qty 1

## 2023-05-17 MED ORDER — KETOROLAC TROMETHAMINE 30 MG/ML IJ SOLN
30.0000 mg | Freq: Once | INTRAMUSCULAR | Status: AC
  Administered 2023-05-17: 30 mg via INTRAMUSCULAR

## 2023-05-17 MED ORDER — OMEPRAZOLE 20 MG PO CPDR: 20.0000 mg | DELAYED_RELEASE_CAPSULE | Freq: Every day | ORAL | 1 refills | Status: DC

## 2023-05-17 MED ORDER — ONDANSETRON 4 MG PO TBDP: 4.0000 mg | ORAL_TABLET | Freq: Three times a day (TID) | ORAL | 0 refills | Status: DC | PRN

## 2023-05-17 MED ORDER — KETOROLAC TROMETHAMINE 30 MG/ML IJ SOLN
INTRAMUSCULAR | Status: AC
  Filled 2023-05-17: qty 1

## 2023-05-17 MED ORDER — ONDANSETRON 4 MG PO TBDP
4.0000 mg | ORAL_TABLET | Freq: Once | ORAL | Status: AC
  Administered 2023-05-17: 4 mg via ORAL

## 2023-05-17 NOTE — Progress Notes (Deleted)
    SUBJECTIVE:   CHIEF COMPLAINT / HPI:   Ann [name] is a 27 y.o. female who presents to the [facility] clinic today to discuss the following concerns:   Muscle Aches  Was seen in the office 6/20 and had COVID/flu swabs at that time which were negative. She was also seen in Urgent Care earlier today ***   PERTINENT  PMH / PSH: Obesity, prediabetes   OBJECTIVE:   There were no vitals taken for this visit.   General: NAD, pleasant, able to participate in exam Cardiac: RRR, no murmurs. Respiratory: CTAB, normal effort, No wheezes, rales or rhonchi Abdomen: *** Back: *** Inspection: Unremarkable  Palpable tenderness: None. Range of Motion:  Flexion 45 deg; Extension 45 deg; Side Bending to 45 deg bilaterally; Rotation to 45 deg bilaterally  Leg strength: Quad: 5/5 Hamstring: 5/5 Hip flexor: 5/5 Hip abductors: 5/5  Strength at foot: Plantar-flexion: 5/5 Dorsi-flexion: 5/5 Eversion: 5/5 Inversion: 5/5  Sensory change: Gross sensation intact to all lumbar and sacral dermatomes.  Reflexes: 2+ at both patellar tendons, 2+ at achilles tendons, Babinski's downgoing.  Gait unremarkable. SLR laying: Negative  XSLR laying: Negative  FABER: negative. Neuro: alert, no obvious focal deficits  ASSESSMENT/PLAN:   No problem-specific Assessment & Plan notes found for this encounter.     Sabino Dick, DO [facility]

## 2023-05-17 NOTE — Assessment & Plan Note (Signed)
Patient has a history of anxiety and depressed mood.  Difficult to assess whether this is an acute episode with underlying psychiatric condition or simply adjustment disorder. - Offered therapy resources - Follow-up in 2 months

## 2023-05-17 NOTE — Assessment & Plan Note (Signed)
Patient without other psoriatic plaques on the skin.  However scalp lesions do appear most similar to psoriasis given gray patches.  Unlikely seborrheic dermatitis.  Given mainly on the scalp will try to start with topicals.  However may benefit from biopsy to ensure that this is psoriasis. - Will schedule with [facility] dermatology clinic

## 2023-05-17 NOTE — Progress Notes (Signed)
SUBJECTIVE:   CHIEF COMPLAINT / HPI:   URI Symptoms  Patient says she would like covid/flu testing as it is required by her job. She works at a daycare. She has been having URI symptoms for the last 2 days and her boyfriend has been having diarrhea, fever.    Vaginal Irritation  Patient mentions she had more irritation for the last 2 weeks.  She says this is similar to when she has had BV in the past.  Denies any STIs.  Denies vaginal discharge.  Says she has been on her.  Recently.  Denies fevers or abdominal pain.  Patient denies using any douches or soaps internally in the vagina.  Mood Disorder  Patient scored 17 on the PHQ-9.  Says that she was having depressive but primarily anxiety symptoms in the past since she was young.  Says that she was nervous to talk to a physician about this.  She is interested in therapy and has coping mechanisms including having her dog, listening to music,  walking.  She recently had has support symptoms as she had to find a new place to live all of a sudden after turning 27.  She now has a place to live.  However, her dog was a very emotional support to her and is not allowed at the apartment due to the brief restrictions.  Psoriasis  Patient reports history of psoriasis since she was young.  Especially in her scalp.  She would have gray flaky patches.  Has never been on any medication for this.  Mom would use down to soap on her head when she was younger.  Patient says that it is painful and itchy.  She stopped getting problems as these would worsen her symptoms.  PERTINENT  PMH / PSH: Paralysis, HSV, duodenitis, eczema, prediabetes  OBJECTIVE:   BP 108/64   Pulse 79   Ht 5\' 9"  (1.753 m)   Wt 271 lb 12.8 oz (123.3 kg)   SpO2 100%   BMI 40.14 kg/m   General: well appearing, in no acute distress CV: RRR, radial pulses equal and palpable, no BLE edema  Resp: Normal work of breathing on room air, CTAB Abd: Soft, non tender, non distended  Neuro:  Alert & Oriented x 4  GU: Chaperoned by CMA, scant blood in the vaginal canal and near the external cervical os, no CMT or cervical friability. Psych: Anxious and almost tearful Skin: Gray flaky plaques at base of the skull, some erythema with gray scale patches on other parts of the scalp.  No yellow crease or drainage.   ASSESSMENT/PLAN:   Psoriasis of scalp Assessment & Plan: Patient without other psoriatic plaques on the skin.  However scalp lesions do appear most similar to psoriasis given gray patches.  Unlikely seborrheic dermatitis.  Given mainly on the scalp will try to start with topicals.  However may benefit from biopsy to ensure that this is psoriasis. - Will schedule with [facility] dermatology clinic  Orders: -     Triamcinolone Acetonide; Apply 1 Application topically 2 (two) times daily.  Dispense: 30 g; Refill: 0 -     Ambulatory referral to Dermatology  Adjustment disorder with mixed anxiety and depressed mood Assessment & Plan: Patient has a history of anxiety and depressed mood.  Difficult to assess whether this is an acute episode with underlying psychiatric condition or simply adjustment disorder. - Offered therapy resources - Follow-up in 2 months   Vaginal irritation Assessment & Plan: Called to  assess any discharge given that patient is on her menstrual cycle.  However given recurrent BV in the past most likely is BV. - Follow-up vaginal swabs.  Orders: -     Cervicovaginal ancillary only  Viral upper respiratory tract infection -     POC SOFIA 2 FLU + SARS ANTIGEN FIA -     POCT rapid strep A      Lockie Mola, MD [facility]

## 2023-05-17 NOTE — Discharge Instructions (Addendum)
Your evaluation suggests that your symptoms are most likely due to viral stomach illness (gastroenteritis) which will improve on its own with rest and fluids in the next few days.   I have prescribed an antinausea medication for you to take at home called Zofran. It is the same medication that we gave you in the office.  You may use tylenol 1,000mg  every 6 hours over the counter as needed for abdominal discomfort related to this virus.   Eat a bland diet for the next 12-24 hours (bananas, rice, white toast, and applesauce) once you are able to tolerate liquids (broth, etc). These foods are easy for your stomach to digest. Pedialyte can be purchased to help with rehydration. Drink plenty of water.   Take prescribed medicines as directed. Medicine will help reduce the amount of acid your stomach makes and therefore improve your reflux symptoms related to acid production.   Avoid spicy or acidic foods like tomatoes, chocolate, coffee, or acidic fruits like oranges as these can trigger symptoms.  I have included acid reflux education in your packet for your review. Please also allow 2  Please follow up with your primary care provider for further management. Return if you experience worsening or uncontrolled pain, inability to tolerate fluids by mouth, difficulty breathing, fevers 100.62F or greater, recurrent vomiting, or any other concerning symptoms. I hope you feel better!

## 2023-05-17 NOTE — ED Provider Notes (Addendum)
[facility]    CSN: [id number] Arrival date & time: 05/17/23  1147      History   Chief Complaint Chief Complaint  Patient presents with   Abdominal Pain    Pt states she is been having generalized body aches mostly on her abd and back with nausea and diarrhea for the past 2 days, was seen by PCP with negative flu and Covid test. Pt feels no getting any better.    HPI Ann [name] is a 27 y.o. female.   Patient presents to urgent care for evaluation of generalized abdominal discomfort, generalized low back pain, nausea, vomiting, and diarrhea for the last 3 to 4 days.  Her partner is sick with similar symptoms and was recently diagnosed with viral gastroenteritis. She is also currently on her menstrual cycle.  She has had some chills with fever.  No dizziness, hematemesis, blood/mucous to the stools, recent antibiotic/steroid use, URI symptoms, or history of abdominal surgeries. Reports intermittent esophageal burning as well. Denies frequent intake of known GERD triggers. Currently nauseous and complaining of generalized abdominal/low back discomfort. No urinary symptoms or vaginal symptoms. Has been using over the counter tylenol and herbal tea without relief.    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 No pertinent surgical  history.  OB History     Gravida  0   Para  0   Term  0   Preterm  0   AB  0   Living  0      SAB  0   IAB  0   Ectopic  0   Multiple  0   Live Births  0            Home Medications    Prior to Admission medications   Medication Sig Start Date End Date Taking? Authorizing Provider  omeprazole (PRILOSEC) 20 MG capsule Take 1 capsule (20 mg total) by mouth daily. 05/17/23  Yes Carlisle Beers, FNP  ondansetron (ZOFRAN-ODT) 4 MG disintegrating tablet Take 1 tablet (4 mg total) by mouth every 8 (eight) hours as needed for nausea or vomiting. 05/17/23  Yes Carlisle Beers, FNP  cetirizine (ZYRTEC) 10 MG tablet Take 1 tablet (10 mg total) by mouth daily. 05/18/22   Sabino Dick, DO  clindamycin (CLEOCIN-T) 1 % external solution Apply topically 2 (two) times daily. When abscesses flare 01/12/22   Jackelyn Poling, DO  metroNIDAZOLE (FLAGYL) 500 MG tablet Take 1 tablet (500 mg total) by mouth 2 (two) times daily. 10/11/22   Alicia Amel, MD  naproxen (NAPROSYN) 500 MG tablet Take 1 tablet (500 mg total) by mouth 2 (two) times daily as needed. 10/11/22   Alicia Amel, MD  triamcinolone ointment (KENALOG) 0.5 % Apply 1 Application topically 2 (two) times daily. 05/16/23   Lockie Mola, MD  valACYclovir (VALTREX) 1000 MG tablet  Take 1 tablet (1,000 mg total) by mouth daily. Take 1 tablet daily for 10 days at the start of an outbreak. 10/11/22   Alicia Amel, MD    Family History Family History  Problem Relation Age of Onset   Diabetes Maternal Uncle    Cancer Maternal Grandmother    Hypertension Maternal Grandmother    Heart disease Neg Hx     Social History Social History   Tobacco Use   Smoking status: Some Days    Types: E-cigarettes    Passive exposure: Current   Smokeless tobacco: Never  Substance Use Topics   Alcohol use: No   Drug use: No     Allergies   Pollen extract   Review of Systems Review of Systems   Gastrointestinal:  Positive for abdominal pain.  Per HPI  Physical Exam Triage Vital Signs ED Triage Vitals [05/17/23 1231]  Enc Vitals Group     BP 127/84     Pulse Rate 73     Resp 18     Temp 98.7 F (37.1 C)     Temp Source Oral     SpO2 98 %     Weight      Height      Head Circumference      Peak Flow      Pain Score 7     Pain Loc      Pain Edu?      Excl. in GC?    No data found.  Updated Vital Signs BP 127/84 (BP Location: Right Arm)   Pulse 73   Temp 98.7 F (37.1 C) (Oral)   Resp 18   SpO2 98%   Visual Acuity Right Eye Distance:   Left Eye Distance:   Bilateral Distance:    Right Eye Near:   Left Eye Near:    Bilateral Near:     Physical Exam Vitals and nursing note reviewed.  Constitutional:      Appearance: She is not ill-appearing or toxic-appearing.  HENT:     Head: Normocephalic and atraumatic.     Right Ear: Hearing and external ear normal.     Left Ear: Hearing and external ear normal.     Nose: Nose normal.     Mouth/Throat:     Lips: Pink.     Mouth: Mucous membranes are moist. No injury.     Tongue: No lesions. Tongue does not deviate from midline.     Palate: No mass and lesions.     Pharynx: Oropharynx is clear. Uvula midline. No pharyngeal swelling, oropharyngeal exudate, posterior oropharyngeal erythema or uvula swelling.     Tonsils: No tonsillar exudate or tonsillar abscesses.  Eyes:     General: Lids are normal. Vision grossly intact. Gaze aligned appropriately.     Extraocular Movements: Extraocular movements intact.     Conjunctiva/sclera: Conjunctivae normal.  Pulmonary:     Effort: Pulmonary effort is normal.  Abdominal:     General: Abdomen is flat. Bowel sounds are normal.     Palpations: Abdomen is soft.     Tenderness: There is generalized abdominal tenderness and tenderness in the epigastric area. There is no right CVA tenderness, left CVA tenderness, guarding or rebound. Negative signs include Murphy's sign and  McBurney's sign.  Musculoskeletal:     Cervical back: Normal range of motion and neck supple.  Skin:    General: Skin is warm and dry.     Capillary Refill: Capillary refill takes less than  2 seconds.     Findings: No rash.  Neurological:     General: No focal deficit present.     Mental Status: She is alert and oriented to person, place, and time. Mental status is at baseline.     Cranial Nerves: No dysarthria or facial asymmetry.  Psychiatric:        Mood and Affect: Mood normal.        Speech: Speech normal.        Behavior: Behavior normal.        Thought Content: Thought content normal.        Judgment: Judgment normal.      UC Treatments / Results  Labs (all labs ordered are listed, but only abnormal results are displayed) Labs Reviewed  POCT URINALYSIS DIP (MANUAL ENTRY) - Abnormal; Notable for the following components:      Result Value   Color, UA red (*)    Bilirubin, UA small (*)    Blood, UA large (*)    Protein Ur, POC =100 (*)    All other components within normal limits  POCT URINE PREGNANCY    EKG   Radiology No results found.  Procedures Procedures (including critical care time)  Medications Ordered in UC Medications  ketorolac (TORADOL) 30 MG/ML injection 30 mg (30 mg Intramuscular Given 05/17/23 1312)  ondansetron (ZOFRAN-ODT) disintegrating tablet 4 mg (4 mg Oral Given 05/17/23 1312)    Initial Impression / Assessment and Plan / UC Course  I have reviewed the triage vital signs and the nursing notes.  Pertinent labs & imaging results that were available during my care of the patient were reviewed by me and considered in my medical decision making (see chart for details).  Generalized abdominal pain, gastroenteritis, negative pregnancy test Presentation is consistent with acute viral gastroenteritis that will likely improve with rest, increased fluid intake, and as needed use of antiemetics (Zofran). May use OTC medications as needed for  abdominal discomfort at home related to viral illness.  Given zofran 4mg  ODT in clinic.  Abdominal exam is without peritoneal signs, patient is nontoxic in appearance, and vitals are hemodynamically stable.  Push fluids with water and pedialyte for rehydration.  Bland/liquid diet recommended for 12-24 hours, then increase diet to normal as tolerated. Ketorolac 30mg  IM for pain. No NSAID for 24 hours.  Urine pregnancy is negative.  Urinalysis is unremarkable for signs of urinary tract infection.  Patient encouraged to increase water intake to stay well hydrated and avoid frequent intake of urinary irritants.   Discussed red flag signs and symptoms of worsening condition,when to call the PCP office, return to urgent care, and when to seek higher level of care in the emergency department. Counseled patient regarding appropriate use of medications and potential side effects for all medications recommended or prescribed today. Patient verbalizes understanding and agreement with plan.    Final Clinical Impressions(s) / UC Diagnoses   Final diagnoses:  Generalized abdominal pain  Gastroenteritis  Negative pregnancy test     Discharge Instructions      Your evaluation suggests that your symptoms are most likely due to viral stomach illness (gastroenteritis) which will improve on its own with rest and fluids in the next few days.   I have prescribed an antinausea medication for you to take at home called Zofran. It is the same medication that we gave you in the office.  You may use tylenol 1,000mg  every 6 hours over the counter as needed for abdominal discomfort  related to this virus.   Eat a bland diet for the next 12-24 hours (bananas, rice, white toast, and applesauce) once you are able to tolerate liquids (broth, etc). These foods are easy for your stomach to digest. Pedialyte can be purchased to help with rehydration. Drink plenty of water.   Take prescribed medicines as directed.  Medicine will help reduce the amount of acid your stomach makes and therefore improve your reflux symptoms related to acid production.   Avoid spicy or acidic foods like tomatoes, chocolate, coffee, or acidic fruits like oranges as these can trigger symptoms.  I have included acid reflux education in your packet for your review. Please also allow 2  Please follow up with your primary care provider for further management. Return if you experience worsening or uncontrolled pain, inability to tolerate fluids by mouth, difficulty breathing, fevers 100.3F or greater, recurrent vomiting, or any other concerning symptoms. I hope you feel better!      ED Prescriptions     Medication Sig Dispense Auth. Provider   omeprazole (PRILOSEC) 20 MG capsule Take 1 capsule (20 mg total) by mouth daily. 30 capsule Reita May M, FNP   ondansetron (ZOFRAN-ODT) 4 MG disintegrating tablet Take 1 tablet (4 mg total) by mouth every 8 (eight) hours as needed for nausea or vomiting. 20 tablet Carlisle Beers, FNP      PDMP not reviewed this encounter.   Carlisle Beers, FNP 05/17/23 1319    Carlisle Beers, FNP 05/17/23 1320

## 2023-05-17 NOTE — ED Triage Notes (Signed)
Pt states she is been having generalized body aches mostly on her abd and back with nausea and diarrhea for the past 2 days, was seen by PCP with negative flu and Covid test. Pt feels no getting any better.

## 2023-05-17 NOTE — Assessment & Plan Note (Signed)
Called to assess any discharge given that patient is on her menstrual cycle.  However given recurrent BV in the past most likely is BV. - Follow-up vaginal swabs.

## 2023-05-19 LAB — CERVICOVAGINAL ANCILLARY ONLY
Comment: NEGATIVE
Trichomonas: NEGATIVE

## 2023-05-20 LAB — CERVICOVAGINAL ANCILLARY ONLY
Bacterial Vaginitis (gardnerella): POSITIVE — AB
Candida Glabrata: NEGATIVE
Candida Vaginitis: NEGATIVE
Chlamydia: NEGATIVE
Comment: NEGATIVE
Comment: NEGATIVE
Comment: NEGATIVE
Comment: NEGATIVE
Comment: NORMAL
Neisseria Gonorrhea: NEGATIVE

## 2023-05-20 MED ORDER — METRONIDAZOLE 500 MG PO TABS
500.0000 mg | ORAL_TABLET | Freq: Three times a day (TID) | ORAL | 0 refills | Status: DC
Start: 2023-05-20 — End: 2023-08-07

## 2023-05-20 NOTE — Addendum Note (Signed)
Addended by: Lockie Mola on: 05/20/2023 02:50 PM   Modules accepted: Orders

## 2023-05-21 ENCOUNTER — Ambulatory Visit: Payer: Medicaid Other | Admitting: Family Medicine

## 2023-05-23 ENCOUNTER — Ambulatory Visit (INDEPENDENT_AMBULATORY_CARE_PROVIDER_SITE_OTHER): Payer: Medicaid Other | Admitting: Student

## 2023-05-23 VITALS — BP 105/71 | HR 56 | Ht 70.0 in | Wt 266.2 lb

## 2023-05-23 DIAGNOSIS — L219 Seborrheic dermatitis, unspecified: Secondary | ICD-10-CM | POA: Diagnosis present

## 2023-05-23 MED ORDER — FLUOCINONIDE 0.05 % EX SOLN
1.0000 | Freq: Two times a day (BID) | CUTANEOUS | 1 refills | Status: DC
Start: 2023-05-23 — End: 2023-10-23

## 2023-05-23 NOTE — Patient Instructions (Addendum)
It was wonderful to meet you today. Thank you for allowing me to be a part of your care. Below is a short summary of what we discussed at your visit today:  The lesions in your scalp are most likely scalp seborrhea.  I have sent in prescription for steroid solution which you will apply in your head twice daily for 2 weeks.  After 2 weeks you should go to once a day or 4 times a week.  Also recommend use of head and shoulder or selsun blu shampoo.  Follow up with your PCP in a month   Please bring all of your medications to every appointment!  If you have any questions or concerns, please do not hesitate to contact us via phone or MyChart message.   Jerre Simon, MD [facility]

## 2023-05-23 NOTE — Progress Notes (Signed)
    SUBJECTIVE:   CHIEF COMPLAINT / HPI:   Patient is a 27 year old female presenting today for evaluation of scalp.  She was sent to the [facility] dermatology clinic for evaluation of scaly scalp lesions suspected to be psoriasis.  She reports she has had history of psoriasis as a kid and has had appearance of these scaly scalp off-and-on for years.  Patient reports scalp are very itchiness when they are damp especially after shower or sweating. No other skin rash and no family history of autoimmune disease. She denies any joint pain or plaques on her skin. She has not tried anything for the hair other than an African ointment. She is considering removing her braid to give the scalp enough time to heal.  PERTINENT  PMH / PSH: Reviewed   OBJECTIVE:   BP 105/71   Pulse (!) 56   Ht 5\' 10"  (1.778 m)   Wt 266 lb 3.2 oz (120.7 kg)   LMP 05/15/2023 (Exact Date)   SpO2 100%   BMI 38.20 kg/m    Physical Exam General: Alert, well appearing, NAD HEENT: diffused  whitish scaly plaque more concentrated in the posterior area but also present at the top of the head with some excoriations but no erythema. Skin: Warm and dry. No skin lesions    ASSESSMENT/PLAN:   Scalp lesion Patient's presentation of pruritic scaly scalp is suspicious of seborrhea vs psoriasis . Suspect more so seborrhea given no skin involvement or thick plaques seen in psoriasis. Will treat with steroid solution and encourage use of shampoo. -Rx Lidex BID for 2 weeks and then once daily afterward - Recommend use of head/shoulder or Selsun Blu shampoo - Follow up with PCP     Jerre Simon, MD [facility]

## 2023-05-28 ENCOUNTER — Ambulatory Visit (INDEPENDENT_AMBULATORY_CARE_PROVIDER_SITE_OTHER): Payer: Medicaid Other | Admitting: Family Medicine

## 2023-05-28 ENCOUNTER — Encounter: Payer: Self-pay | Admitting: Family Medicine

## 2023-05-28 VITALS — BP 112/60 | HR 79 | Ht 70.0 in | Wt 268.8 lb

## 2023-05-28 DIAGNOSIS — N6314 Unspecified lump in the right breast, lower inner quadrant: Secondary | ICD-10-CM | POA: Diagnosis not present

## 2023-05-28 DIAGNOSIS — N63 Unspecified lump in unspecified breast: Secondary | ICD-10-CM | POA: Diagnosis present

## 2023-05-28 DIAGNOSIS — N6310 Unspecified lump in the right breast, unspecified quadrant: Secondary | ICD-10-CM | POA: Insufficient documentation

## 2023-05-28 NOTE — Assessment & Plan Note (Signed)
Given that the patient has a prior history of hidradenitis axillaris ordered an ultrasound of the breast to further evaluate characterize the lump.  Will review her results with her primary care doctor and if incision and drainage is appropriate we will refer her to a surgeon.

## 2023-05-28 NOTE — Patient Instructions (Signed)
It was wonderful to see you today.  Today we talked about:  The lump on your right breast. We have ordered an ultrasound of the breast to investigate further. When we have the results, we will have you follow up with your PCP for further management.   Thank you for choosing [facility].   Please call [phone] with any questions about today's appointment.  Please arrive at least 15 minutes prior to your scheduled appointments.   If you had blood work today, I will send you a MyChart message or a letter if results are normal. Otherwise, I will give you a call.   If you had a referral placed, they will call you to set up an appointment. Please give [place] a call if you don't hear back in the next 2 weeks.   If you need additional refills before your next appointment, please call your pharmacy first.   Gerrit Heck, DO Family Medicine

## 2023-05-28 NOTE — Progress Notes (Signed)
    SUBJECTIVE:   CHIEF COMPLAINT / HPI:   Breast abscess, hx of cyst under breast previously and hidradenitis axillaris.  Has been present for approximately 1 week.  Denies fevers chills.  No swelling or drainage from the site.  This has happened once before and she was told it was hidradenitis axillaris.  She does state that warm compresses help relieve some of the discomfort.  PERTINENT  PMH / PSH: Above  OBJECTIVE:   BP 112/60   Pulse 79   Wt 268 lb 12.8 oz (121.9 kg)   LMP 05/15/2023 (Exact Date)   SpO2 97%   BMI 38.57 kg/m   Cardiovascular: Regular rate and rhythm, S1-S2 present.  No murmurs rubs gallops Respiratory: Clear to auscultation bilaterally. Breast: 3 cm x 1 cm firm, mobile mass located in inner bottom quadrant of right breast.  Nontender no discharge present.  ASSESSMENT/PLAN:   Breast mass, right Given that the patient has a prior history of hidradenitis axillaris ordered an ultrasound of the breast to further evaluate characterize the lump.  Will review her results with her primary care doctor and if incision and drainage is appropriate we will refer her to a surgeon.     Gerrit Heck, DO [facility]

## 2023-06-04 ENCOUNTER — Ambulatory Visit
Admission: RE | Admit: 2023-06-04 | Discharge: 2023-06-04 | Disposition: A | Discharge status: Home / Self Care | Payer: Medicaid Other | Source: Ambulatory Visit | Attending: Family Medicine | Admitting: Family Medicine

## 2023-06-04 ENCOUNTER — Telehealth: Payer: Self-pay | Admitting: Family Medicine

## 2023-06-04 ENCOUNTER — Other Ambulatory Visit: Payer: Self-pay | Admitting: Family Medicine

## 2023-06-04 DIAGNOSIS — N63 Unspecified lump in unspecified breast: Secondary | ICD-10-CM

## 2023-06-04 DIAGNOSIS — N6320 Unspecified lump in the left breast, unspecified quadrant: Secondary | ICD-10-CM | POA: Insufficient documentation

## 2023-06-04 NOTE — Telephone Encounter (Signed)
Spoke with patient regarding results of breast ultrasound. Per the report the lump was a benign cystic collection which had burst per the patient's report at the time of the exam. She has no concerns at this time and reports that the cyst is healing well. Discussed red flags to watch out for such as increased or new pain, swelling, redness and fever which would indicate need to make an appointment or go to the emergency room. Instructed patient to follow up with PCP as needed.

## 2023-06-04 NOTE — Progress Notes (Signed)
Urgent referral placed for left breast mass, received message from GI that surgery referral in network needed for patient.  Burley Saver MD

## 2023-06-06 ENCOUNTER — Telehealth: Payer: Self-pay

## 2023-06-17 NOTE — Telephone Encounter (Signed)
Jasmine December with Dr. Suzan Garibaldi office LVM on nurse line trying to reach [facility].  She reports they have still not been able to contact patient to schedule.   At this point she does not know what to do with the referral and asks to speak to [facility].   Will forward to referral coordinator.

## 2023-07-12 ENCOUNTER — Ambulatory Visit: Payer: Medicaid Other | Admitting: Student
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
## 2023-08-05 ENCOUNTER — Ambulatory Visit: Payer: Medicaid Other

## 2023-08-07 ENCOUNTER — Other Ambulatory Visit (HOSPITAL_COMMUNITY)
Admission: RE | Admit: 2023-08-07 | Discharge: 2023-08-07 | Disposition: A | Discharge status: Home / Self Care | Payer: Medicaid Other | Source: Ambulatory Visit | Attending: Family Medicine | Admitting: Family Medicine

## 2023-08-07 ENCOUNTER — Encounter: Payer: Self-pay | Admitting: Family Medicine

## 2023-08-07 ENCOUNTER — Ambulatory Visit (INDEPENDENT_AMBULATORY_CARE_PROVIDER_SITE_OTHER): Payer: Medicaid Other | Admitting: Family Medicine

## 2023-08-07 VITALS — BP 118/67 | HR 73 | Wt 265.0 lb

## 2023-08-07 DIAGNOSIS — N750 Cyst of Bartholin's gland: Secondary | ICD-10-CM

## 2023-08-07 DIAGNOSIS — N898 Other specified noninflammatory disorders of vagina: Secondary | ICD-10-CM | POA: Diagnosis present

## 2023-08-07 LAB — POCT WET PREP (WET MOUNT)
Clue Cells Wet Prep Whiff POC: POSITIVE
Trichomonas Wet Prep HPF POC: ABSENT

## 2023-08-07 MED ORDER — METRONIDAZOLE 500 MG PO TABS: 500.0000 mg | ORAL_TABLET | Freq: Two times a day (BID) | ORAL | 0 refills | Status: AC

## 2023-08-07 NOTE — Patient Instructions (Addendum)
GO WHITE: Soap: UNSCENTED Dove (white box light green writing) Laundry detergent (underwear)- Dreft or [employer] unscented WHITE 100% cotton panties (NOT just cotton crouch) Sanitary napkin/panty liners: UNSCENTED.  If it doesn't SAY unscented it can have a scent/perfume    NO PERFUMES OR LOTIONS OR POTIONS in the vulvar area (may use regular KY) Condoms: hypoallergenic only. Non dyed (no color) Toilet papers: white only Wash clothes: use a separate wash cloth. WHITE.  Washed in Dreft.    It was great to see you!  Our plans for today:  - We are checking some labs today, we will release these results to your MyChart. - Make sure you wear condoms every time you have sex for the full time. - Let us know if you don't hear about an appointment with Gynecology soon.   Take care and seek immediate care sooner if you develop any concerns.   Dr. Linwood Dibbles

## 2023-08-07 NOTE — Progress Notes (Signed)
   SUBJECTIVE:   CHIEF COMPLAINT / HPI:   Vaginal cyst - h/o bartholin cyst, HS - has had for the past 3 weeks, has not yet come to a head. - has not done sitz baths - has not taken any meds.  - no vaginal discharge but feels she has BV - sexually active with one female partner, does not wear condoms.   OBJECTIVE:   BP 118/67 (BP Location: Left Arm, Patient Position: Sitting, Cuff Size: Normal)   Pulse 73   Wt 265 lb (120.2 kg)   SpO2 100%   BMI 38.02 kg/m   Gen: well appearing, in NAD GYN:  External genitalia within normal limits.  Small ~1cm area of induration without fluctuance or erythema to R sided inferior vulva. Vaginal mucosa pink, moist, normal rugae.  Nonfriable cervix without lesions, small amount of white discharge noted on speculum exam, no bleeding.  No cervical motion tenderness.    ASSESSMENT/PLAN:   Bartholin gland cyst Present on exam, no signs of infection, no indication for I&D today. Will refer to GYN for excision.    Vaginal discharge  Wet prep +BV. Rx flagyl x7 days. Counseled on preventing recurrence with boric acid suppositories, has at home.   Caro Laroche, DO

## 2023-08-07 NOTE — Assessment & Plan Note (Signed)
Present on exam, no signs of infection, no indication for I&D today. Will refer to GYN for excision.

## 2023-08-09 LAB — CERVICOVAGINAL ANCILLARY ONLY
Bacterial Vaginitis (gardnerella): POSITIVE — AB
Candida Glabrata: POSITIVE — AB
Candida Vaginitis: NEGATIVE
Chlamydia: NEGATIVE
Comment: NEGATIVE
Comment: NEGATIVE
Comment: NEGATIVE
Comment: NEGATIVE
Comment: NEGATIVE
Comment: NORMAL
Neisseria Gonorrhea: NEGATIVE
Trichomonas: NEGATIVE

## 2023-09-10 ENCOUNTER — Ambulatory Visit: Payer: Medicaid Other

## 2023-09-17 ENCOUNTER — Ambulatory Visit: Payer: Medicaid Other

## 2023-09-17 DIAGNOSIS — Z23 Encounter for immunization: Secondary | ICD-10-CM

## 2023-09-17 NOTE — Progress Notes (Signed)
Patient presents in nurse clinic for Flu vaccine. Vaccine administered without complication. See admin for details.

## 2023-10-23 ENCOUNTER — Telehealth: Payer: Self-pay | Admitting: Student

## 2023-10-23 ENCOUNTER — Ambulatory Visit (INDEPENDENT_AMBULATORY_CARE_PROVIDER_SITE_OTHER): Payer: Medicaid Other | Admitting: Student

## 2023-10-23 ENCOUNTER — Other Ambulatory Visit (HOSPITAL_COMMUNITY)
Admission: RE | Admit: 2023-10-23 | Discharge: 2023-10-23 | Disposition: A | Discharge status: Home / Self Care | Payer: Medicaid Other | Source: Ambulatory Visit | Attending: Family Medicine | Admitting: Family Medicine

## 2023-10-23 VITALS — BP 99/67 | HR 72 | Ht 70.0 in | Wt 266.6 lb

## 2023-10-23 DIAGNOSIS — N898 Other specified noninflammatory disorders of vagina: Secondary | ICD-10-CM | POA: Diagnosis present

## 2023-10-23 DIAGNOSIS — L409 Psoriasis, unspecified: Secondary | ICD-10-CM

## 2023-10-23 DIAGNOSIS — R894 Abnormal immunological findings in specimens from other organs, systems and tissues: Secondary | ICD-10-CM

## 2023-10-23 DIAGNOSIS — Z113 Encounter for screening for infections with a predominantly sexual mode of transmission: Secondary | ICD-10-CM | POA: Diagnosis present

## 2023-10-23 DIAGNOSIS — L219 Seborrheic dermatitis, unspecified: Secondary | ICD-10-CM | POA: Diagnosis not present

## 2023-10-23 LAB — POCT WET PREP (WET MOUNT)
Clue Cells Wet Prep Whiff POC: POSITIVE
Trichomonas Wet Prep HPF POC: ABSENT

## 2023-10-23 LAB — POCT URINALYSIS DIP (MANUAL ENTRY)
Bilirubin, UA: NEGATIVE
Blood, UA: NEGATIVE
Glucose, UA: NEGATIVE mg/dL
Ketones, POC UA: NEGATIVE mg/dL
Leukocytes, UA: NEGATIVE
Nitrite, UA: NEGATIVE
Protein Ur, POC: NEGATIVE mg/dL
Spec Grav, UA: 1.015 (ref 1.010–1.025)
Urobilinogen, UA: 0.2 U/dL
pH, UA: 7 (ref 5.0–8.0)

## 2023-10-23 MED ORDER — VALACYCLOVIR HCL 1 G PO TABS: 1000.0000 mg | ORAL_TABLET | Freq: Every day | ORAL | 1 refills | Status: DC

## 2023-10-23 MED ORDER — TRIAMCINOLONE ACETONIDE 0.5 % EX OINT: 1.0000 | TOPICAL_OINTMENT | Freq: Two times a day (BID) | CUTANEOUS | 0 refills | Status: DC

## 2023-10-23 MED ORDER — METRONIDAZOLE 500 MG PO TABS
500.0000 mg | ORAL_TABLET | Freq: Two times a day (BID) | ORAL | 0 refills | Status: AC
Start: 2023-10-23 — End: 2023-10-30

## 2023-10-23 MED ORDER — FLUOCINONIDE 0.05 % EX SOLN
1.0000 | Freq: Two times a day (BID) | CUTANEOUS | 1 refills | Status: DC
Start: 2023-10-23 — End: 2024-01-02

## 2023-10-23 NOTE — Progress Notes (Signed)
  SUBJECTIVE:   CHIEF COMPLAINT / HPI:   Vaginal Irritation and pain Patient comes in for concern of vaginal irritation.  Patient appreciates that she has had some discomfort with sex, she believes secondary to a Bartholin cyst on her labia.  Patient also wanting to be retested for BV, to see if she is cleared it.  Patient does not use protection, or birth control.  Patient would prefer to use natural remedy for birth control, as she has had issues with taking hormones.  Patient denies any vaginal discharge, burning, itching, redness.  Patient appreciates that she took her BV medicine last week, and is hoping she is all clear. No systemic symptoms. Has had same partner, and appreciates vaginal pain with sex intermittently for the lat 4 months.   PERTINENT  PMH / PSH:    OBJECTIVE:  There were no vitals taken for this visit. Physical Exam Exam conducted with a chaperone present.  Constitutional:      General: She is not in acute distress.    Appearance: Normal appearance. She is not ill-appearing.  Genitourinary:    General: Normal vulva.     Labia:        Right: No rash, tenderness, lesion or injury.        Left: No rash, tenderness, lesion or injury.      Vagina: Normal. No signs of injury and foreign body. No vaginal discharge, erythema, tenderness, bleeding or lesions.     Cervix: Normal. No cervical motion tenderness, discharge, friability, lesion, erythema, cervical bleeding or eversion.  Neurological:     Mental Status: She is alert.      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 Bess Kinds, MD 10/23/2023, 7:27 AM PGY-3, [facility]

## 2023-10-23 NOTE — Telephone Encounter (Signed)
Called to inform patient that testing shows patient has BV.  Provider unable to reach patient as voicemail was not set up.  Provider will send in prescription for metronidazole to be taken twice a day for 7 days.  Prescription sent to [facility] on [address] in [employer]

## 2023-10-23 NOTE — Patient Instructions (Addendum)
It was great to see you! Thank you for allowing me to participate in your care!  I recommend that you always bring your medications to each appointment as this makes it easy to ensure we are on the correct medications and helps [place] not miss when refills are needed.  Our plans for today:  - Sexually Transmitted Infection screening Screening you for STI's, will send in medicine for any positive results.   Testing urine for Urinary Tract Infection  -Bartholin Cyst Has resolved, I do not see anything there anymore. I do not believe this is the cause of your pain/discomfort.    We are checking some labs today, I will call you if they are abnormal or will send you a MyChart message. Or I will send a letter if they are normal.  If you do not hear about your labs in the next 2 weeks please let us know.  Take care and seek immediate care sooner if you develop any concerns.   Dr. Bess Kinds, MD [facility]

## 2023-10-24 LAB — TREPONEMAL ANTIBODIES, TPPA: Treponemal Antibodies, TPPA: NONREACTIVE

## 2023-10-24 LAB — HIV ANTIBODY (ROUTINE TESTING W REFLEX): HIV Screen 4th Generation wRfx: NONREACTIVE

## 2023-10-24 LAB — RPR W/REFLEX TO TREPSURE: RPR: NONREACTIVE

## 2023-10-25 LAB — CERVICOVAGINAL ANCILLARY ONLY
Bacterial Vaginitis (gardnerella): POSITIVE — AB
Candida Glabrata: POSITIVE — AB
Candida Vaginitis: NEGATIVE
Chlamydia: NEGATIVE
Comment: NEGATIVE
Comment: NEGATIVE
Comment: NEGATIVE
Comment: NEGATIVE
Comment: NEGATIVE
Comment: NORMAL
Neisseria Gonorrhea: NEGATIVE
Trichomonas: NEGATIVE

## 2023-10-31 NOTE — Assessment & Plan Note (Signed)
Patient comes in for vaginal irritation w/ sex sporadically for the last 4 months. Patient has the same partner and does not use protection or birth control. Patient wants STI screening today. Patient found to have BV and candidiasis, will treat.  -Metronidazole BID x 7 days -Diflucan x 1 w/ negative pregnancy test -STI screen (BV, yeast, HIV, RPR, GC. CT)

## 2023-11-05 ENCOUNTER — Telehealth: Payer: Self-pay | Admitting: Student

## 2023-11-05 ENCOUNTER — Other Ambulatory Visit: Payer: Self-pay

## 2023-11-05 ENCOUNTER — Encounter: Payer: Self-pay | Admitting: Family Medicine

## 2023-11-05 ENCOUNTER — Ambulatory Visit (INDEPENDENT_AMBULATORY_CARE_PROVIDER_SITE_OTHER): Payer: Medicaid Other | Admitting: Family Medicine

## 2023-11-05 VITALS — BP 105/64 | HR 71 | Wt 268.0 lb

## 2023-11-05 DIAGNOSIS — N750 Cyst of Bartholin's gland: Secondary | ICD-10-CM

## 2023-11-05 LAB — POCT PREGNANCY, URINE: Preg Test, Ur: NEGATIVE

## 2023-11-05 MED ORDER — METRONIDAZOLE 0.75 % VA GEL
1.0000 | Freq: Every day | VAGINAL | Status: AC
Start: 2023-11-05 — End: 2023-11-10

## 2023-11-05 MED ORDER — FLUCONAZOLE 150 MG PO TABS: 150.0000 mg | ORAL_TABLET | Freq: Once | ORAL | 0 refills | Status: AC

## 2023-11-05 NOTE — Progress Notes (Unsigned)
    SUBJECTIVE:   CHIEF COMPLAINT / HPI:   Vaginal discharge  Vaginal pain  Recurrent Bartholin gland cysts Patient presenting for evaluation for recurrent Bartholin gland cyst PERTINENT  PMH / PSH: ***  OBJECTIVE:   BP 105/64   Pulse 71   Wt 268 lb (121.6 kg)   BMI 38.45 kg/m   ***  ASSESSMENT/PLAN:   No problem-specific Assessment & Plan notes found for this encounter.     Derrel Nip, MD Attending Family Medicine Physician, [facility] for [facility]

## 2023-11-05 NOTE — Telephone Encounter (Signed)
Called to discuss results with patient. Patient had received results but had not taken abx for BV yet. Discussed  BV and Yeast Infextion with patient. Patient request vaginal gel for BV treatment as the metronidazole pill upsets her stomach. Patient planning to meet with her OB again.   Patient planing to take diflucan and metronidazole gel for Tx. Discussed need for negative pregnancy test for diflucan, as medicine can hurt baby. Patient understands and is in agreement.

## 2023-11-05 NOTE — Addendum Note (Signed)
Addended by: Bess Kinds T on: 11/05/2023 01:18 PM   Modules accepted: Orders

## 2023-11-11 NOTE — Assessment & Plan Note (Signed)
No visible Carprofen gland cyst on today's exam.  Had discussion regarding treatment for the Bartholin gland cyst and that there are procedures for excision of them.  Discussed with her the challenges of removal of the cysts when they are not inflamed.  Patient will return if the plans of the excision may be possible and schedule an appointment with one of our gynecologist.

## 2023-12-09 ENCOUNTER — Other Ambulatory Visit: Payer: Self-pay

## 2023-12-09 ENCOUNTER — Emergency Department (HOSPITAL_BASED_OUTPATIENT_CLINIC_OR_DEPARTMENT_OTHER)
Admission: EM | Admit: 2023-12-09 | Discharge: 2023-12-09 | Disposition: A | Discharge status: Home / Self Care | Payer: Medicaid Other | Attending: Emergency Medicine | Admitting: Emergency Medicine

## 2023-12-09 ENCOUNTER — Emergency Department (HOSPITAL_BASED_OUTPATIENT_CLINIC_OR_DEPARTMENT_OTHER): Payer: Medicaid Other

## 2023-12-09 DIAGNOSIS — T535X1A Toxic effect of chlorofluorocarbons, accidental (unintentional), initial encounter: Secondary | ICD-10-CM | POA: Insufficient documentation

## 2023-12-09 DIAGNOSIS — T5991XA Toxic effect of unspecified gases, fumes and vapors, accidental (unintentional), initial encounter: Secondary | ICD-10-CM

## 2023-12-09 NOTE — ED Notes (Signed)
 Pt given discharge instructions. Opportunities given for questions. Pt verbalizes understanding. Jillyn Hidden, RN

## 2023-12-09 NOTE — ED Notes (Signed)
 Pt stating her SOB has improved and anxiety is also better. MD and respiratory at bedside.

## 2023-12-09 NOTE — ED Triage Notes (Signed)
 Pt was driving to work. While in the car she smelled a strange smell. Pt states its a freon leak. Pt began having dizziness and SOB. Pt states she had a panic attack as well. Also c/o heaviness in the chest. Pt very anxious during triage.

## 2023-12-09 NOTE — ED Provider Notes (Signed)
 [place] EMERGENCY DEPARTMENT AT [facility] Provider Note   CSN: [id number] Arrival date & time: 12/09/23  9352     History  Chief Complaint  Patient presents with   Shortness of Breath    Ann Ann [name] is a 28 y.o. female.  She states that she was driving to work this morning and smelled a chemical odor coming from her car, which she believes is a Freon leak.  She then began to experience lightheadedness, dizziness, and facial heaviness.  She pulled over to the side of the road and got out of her car, and began to have what she describes as a panic attack.  She denies any chest pain, loss of consciousness, nausea or vomiting, wheezing, or shortness of breath.  Her symptoms have now resolved. She states this happened 3 weeks ago as well when she smelled that same smell while driving.  She had similar symptoms at that time but did not go to the emergency department and the symptoms resolved on their own when she returned home.  She denies any new medications or any recent drug use.     Home Medications Prior to Admission medications   Medication Sig Start Date End Date Taking? Authorizing Provider  cetirizine  (ZYRTEC ) 10 MG tablet Take 1 tablet (10 mg total) by mouth daily. 05/18/22   Espinoza, Alejandra, DO  clindamycin  (CLEOCIN -T) 1 % external solution Apply topically 2 (two) times daily. When abscesses flare 01/12/22   Dayna Motto, DO  fluocinonide  (LIDEX ) 0.05 % external solution Apply 1 Application topically 2 (two) times daily. 10/23/23   Jennelle Riis, MD  naproxen  (NAPROSYN ) 500 MG tablet Take 1 tablet (500 mg total) by mouth 2 (two) times daily as needed. 10/11/22   Marlee Lynwood NOVAK, MD  omeprazole  (PRILOSEC) 20 MG capsule Take 1 capsule (20 mg total) by mouth daily. 05/17/23   Enedelia Dorna HERO, FNP  ondansetron  (ZOFRAN -ODT) 4 MG disintegrating tablet Take 1 tablet (4 mg total) by mouth every 8 (eight) hours as needed for nausea or vomiting. 05/17/23    Enedelia Dorna HERO, FNP  triamcinolone  ointment (KENALOG ) 0.5 % Apply 1 Application topically 2 (two) times daily. 10/23/23   Jennelle Riis, MD  valACYclovir  (VALTREX ) 1000 MG tablet Take 1 tablet (1,000 mg total) by mouth daily. Take 1 tablet daily for 10 days at the start of an outbreak. 10/23/23   Jennelle Riis, MD      Allergies    Pollen extract    Review of Systems   Review of Systems  Constitutional:  Negative for chills and fever.  HENT:  Positive for sinus pressure. Negative for congestion, drooling and sore throat.   Eyes: Negative.   Respiratory:  Negative for cough, chest tightness, shortness of breath and wheezing.   Cardiovascular:  Negative for chest pain and palpitations.  Gastrointestinal: Negative.   Endocrine: Negative.   Genitourinary: Negative.   Musculoskeletal: Negative.   Skin: Negative.   Allergic/Immunologic: Negative.   Neurological:  Negative for dizziness, syncope, weakness, light-headedness and numbness.  Hematological: Negative.   Psychiatric/Behavioral:  The patient is nervous/anxious.    Physical Exam Updated Vital Signs BP 126/81   Pulse (!) 103   Temp 98.1 F (36.7 C)   Resp (!) 24   Ht 5' 9 (1.753 m)   Wt 119.3 kg   LMP 12/06/2023   SpO2 99%   BMI 38.84 kg/m  Physical Exam HENT:     Ann [name]: Normocephalic and atraumatic.  Cardiovascular:  Rate and Rhythm: Normal rate and regular rhythm.     Heart sounds: Normal heart sounds.  Pulmonary:     Effort: Pulmonary effort is normal. Tachypnea present.     Breath sounds: Normal breath sounds.  Abdominal:     Palpations: Abdomen is soft.  Musculoskeletal:        General: Normal range of motion.  Neurological:     General: No focal deficit present.     Mental Status: She is alert and oriented to person, place, and time.    ED Results / Procedures / Treatments   Labs (all labs ordered are listed, but only abnormal results are displayed) Labs Reviewed - No data to  display  EKG EKG Interpretation Date/Time:  Monday December 09 2023 07:01:46 EST Ventricular Rate:  90 PR Interval:  170 QRS Duration:  88 QT Interval:  366 QTC Calculation: 448 R Axis:   -10  Text Interpretation: Sinus rhythm Borderline T abnormalities, anterior leads No significant change since last tracing Confirmed by Emil Share [phone]) on 12/09/2023 7:32:57 AM  Radiology DG Chest Port 1 View Result Date: 12/09/2023 CLINICAL DATA:  28 year old female with shortness of breath and dizziness, possible inhalational injury when driving car this morning. EXAM: EXAM PORTABLE CHEST 1 VIEW COMPARISON:  None Available. FINDINGS: Portable AP upright view at 0705 hours. Low normal lung volumes. Normal cardiac size and mediastinal contours. Visualized tracheal air column is within normal limits. Allowing for portable technique the lungs are clear. No pneumothorax or pleural effusion. Paucity of bowel gas. No osseous abnormality identified. IMPRESSION: Negative portable chest. Electronically Signed   By: VEAR Hurst M.D.   On: 12/09/2023 07:27   Medications Ordered in ED Medications - No data to display  ED Course/ Medical Decision Making/ A&P                                 Medical Decision Making Amount and/or Complexity of Data Reviewed Labs: ordered. Radiology: ordered.   This patient presents to the ED for concern of shortness of breath secondary to possible gas exposure.  Vital signs are stable. She is asymptomatic on exam with a normal cardiopulmonary and neurological exam.  We ordered an EKG, which was sinus rhythm with no significant changes from prior.  Portable chest x-ray was negative for any acute abnormalities.  Her symptoms resolved on their own without any medical intervention. Given these findings, I do not feel she needs further medical workup at this time.   She is medically stable for discharge with outpatient follow up with her primary care physician. Return precautions were  given and the patient agreed with this plan.    Final Clinical Impression(s) / ED Diagnoses Final diagnoses:  Toxic effect of gas exposure, accidental or unintentional, initial encounter     Gregary Sharper, MD 12/09/23 0754    Emil Share, DO 12/09/23 9194

## 2023-12-09 NOTE — Discharge Instructions (Signed)
 Ms. Ann, [name] to the emergency department with shortness of breath with concern of gas exposure.  We did some testing including an EKG and a chest x-ray, which were normal.  Her symptoms have improved and you are safe for discharge.  We recommend following up with your family doctor in the next 1 to 2 weeks.   We also recommend getting your car fixed to avoid further gas exposure.  If you experience any shortness of breath, lightheadedness, dizziness, chest pain, or lose consciousness, please return to the emergency department for evaluation.

## 2023-12-09 NOTE — ED Notes (Signed)
 RT Note:  Dr. Adela Lank no longer wanted a peak flow meter for this patient. Patient is in no distress at this time

## 2023-12-19 ENCOUNTER — Ambulatory Visit
Admission: RE | Admit: 2023-12-19 | Discharge: 2023-12-19 | Disposition: A | Discharge status: Home / Self Care | Payer: Medicaid Other | Source: Ambulatory Visit | Attending: Family Medicine | Admitting: Family Medicine

## 2023-12-19 ENCOUNTER — Ambulatory Visit (INDEPENDENT_AMBULATORY_CARE_PROVIDER_SITE_OTHER): Payer: Medicaid Other | Admitting: Student

## 2023-12-19 VITALS — HR 96 | Wt 265.4 lb

## 2023-12-19 DIAGNOSIS — B349 Viral infection, unspecified: Secondary | ICD-10-CM | POA: Diagnosis present

## 2023-12-19 DIAGNOSIS — R0602 Shortness of breath: Secondary | ICD-10-CM

## 2023-12-19 DIAGNOSIS — Z3200 Encounter for pregnancy test, result unknown: Secondary | ICD-10-CM

## 2023-12-19 LAB — POC SOFIA 2 FLU + SARS ANTIGEN FIA
Influenza A, POC: NEGATIVE
Influenza B, POC: NEGATIVE
SARS Coronavirus 2 Ag: NEGATIVE

## 2023-12-19 NOTE — Patient Instructions (Addendum)
Doyne Keel to see you!  Please go to [facility] at [employer] to get your X-Ray done. They are open 7:30a-5p Monday-Friday. You do not need an appointment to get this done.  If you start having fevers or a harder time breathing, come back to see [place] sooner rather than later.  Eliezer Mccoy, MD

## 2023-12-19 NOTE — Progress Notes (Signed)
    SUBJECTIVE:   CHIEF COMPLAINT / HPI:   URI Symptoms Cough, sore throat, general malaise x2 weeks. No real fevers. Sputum is clear/white. No CP or respiratory distress with this. Here today given duration of symptoms and concern she may have "walking pneumonia."  OBJECTIVE:   Pulse 96   Wt 265 lb 6.4 oz (120.4 kg)   LMP 12/06/2023   BMI 39.19 kg/m   Gen: A bit tired appearing but non-toxic and NAD HENT: MMM, nares patent, oropharynx clear and without exudate or erythema Neck: Without LAD Cardio: RRR, without murmur Pulm: Normal WOB on RA and speaking in full sentences, lungs without aberrant lung sounds, but she does seem diminished on the R compared to the L Skin: Without rash  ASSESSMENT/PLAN:   Viral syndrome Symptoms x 2 weeks. Suspect this is viral and should be resolving in the coming days. No red flags on history, but given apparently diminished breath sounds on the R, will obtain a CXR to r/o superimposed PNA. - COVID/flu negative today - CXR - Supportive care reviewed      J Dorothyann Gibbs, MD [facility]

## 2023-12-20 ENCOUNTER — Encounter: Payer: Self-pay | Admitting: Student

## 2023-12-20 DIAGNOSIS — B349 Viral infection, unspecified: Secondary | ICD-10-CM | POA: Insufficient documentation

## 2023-12-20 NOTE — Assessment & Plan Note (Signed)
Symptoms x 2 weeks. Suspect this is viral and should be resolving in the coming days. No red flags on history, but given apparently diminished breath sounds on the R, will obtain a CXR to r/o superimposed PNA. - COVID/flu negative today - CXR - Supportive care reviewed

## 2023-12-26 ENCOUNTER — Ambulatory Visit: Payer: Medicaid Other | Admitting: Student

## 2023-12-26 NOTE — Progress Notes (Deleted)
  SUBJECTIVE:   CHIEF COMPLAINT / HPI:   Anxiety Attacks   PERTINENT  PMH / PSH: ***  Past Medical History:  Diagnosis Date   Possible exposure to STD 03/26/2019   Trichomonas vaginitis 07/27/2021   OBJECTIVE:  LMP 12/06/2023  Physical Exam   ASSESSMENT/PLAN:   Assessment & Plan  No follow-ups on file. Bess Kinds, MD 12/26/2023, 12:56 PM PGY-***, [facility] {    This will disappear when note is signed, click to select method of visit    :1}

## 2023-12-26 NOTE — Patient Instructions (Incomplete)
It was great to see you! Thank you for allowing me to participate in your care!  I recommend that you always bring your medications to each appointment as this makes it easy to ensure we are on the correct medications and helps [place] not miss when refills are needed.  Our plans for today:  - Anxiety attacks -   We are checking some labs today, I will call you if they are abnormal will send you a MyChart message or a letter if they are normal.  If you do not hear about your labs in the next 2 weeks please let us know.***  Take care and seek immediate care sooner if you develop any concerns.   Dr. Bess Kinds, MD [facility]

## 2024-01-02 ENCOUNTER — Encounter (HOSPITAL_BASED_OUTPATIENT_CLINIC_OR_DEPARTMENT_OTHER): Payer: Self-pay | Admitting: Family Medicine

## 2024-01-02 ENCOUNTER — Other Ambulatory Visit (HOSPITAL_BASED_OUTPATIENT_CLINIC_OR_DEPARTMENT_OTHER): Payer: Self-pay

## 2024-01-02 ENCOUNTER — Telehealth (HOSPITAL_BASED_OUTPATIENT_CLINIC_OR_DEPARTMENT_OTHER): Payer: Self-pay | Admitting: *Deleted

## 2024-01-02 ENCOUNTER — Ambulatory Visit (INDEPENDENT_AMBULATORY_CARE_PROVIDER_SITE_OTHER): Payer: Medicaid Other | Admitting: Family Medicine

## 2024-01-02 VITALS — BP 123/78 | HR 67 | Ht 69.0 in | Wt 258.6 lb

## 2024-01-02 DIAGNOSIS — N760 Acute vaginitis: Secondary | ICD-10-CM | POA: Diagnosis not present

## 2024-01-02 DIAGNOSIS — F411 Generalized anxiety disorder: Secondary | ICD-10-CM

## 2024-01-02 DIAGNOSIS — F410 Panic disorder [episodic paroxysmal anxiety] without agoraphobia: Secondary | ICD-10-CM | POA: Diagnosis not present

## 2024-01-02 DIAGNOSIS — B3731 Acute candidiasis of vulva and vagina: Secondary | ICD-10-CM

## 2024-01-02 DIAGNOSIS — B9689 Other specified bacterial agents as the cause of diseases classified elsewhere: Secondary | ICD-10-CM

## 2024-01-02 MED ORDER — FLUCONAZOLE 150 MG PO TABS
150.0000 mg | ORAL_TABLET | Freq: Once | ORAL | 0 refills | Status: AC
  Filled 2024-01-02: qty 2, 3d supply, fill #0

## 2024-01-02 MED ORDER — FLUOXETINE HCL 10 MG PO CAPS
10.0000 mg | ORAL_CAPSULE | Freq: Every day | ORAL | 3 refills | Status: DC
  Filled 2024-01-02: qty 30, 30d supply, fill #0

## 2024-01-02 MED ORDER — METRONIDAZOLE 500 MG PO TABS
500.0000 mg | ORAL_TABLET | Freq: Two times a day (BID) | ORAL | 0 refills | Status: DC
  Filled 2024-01-02: qty 14, 7d supply, fill #0

## 2024-01-02 NOTE — Telephone Encounter (Signed)
 Copied from CRM [id number]. Topic: Clinical - Medication Question >> Jan 02, 2024  3:41 PM Antwanette L wrote: Reason for CRM: Patient would like for someone to call her back regarding her assessment plan that nurse practitioner Thersia Stark put in place today. Patient can be reached at  [phone]    [facility] and spoke with pt. Pt has concerns about the EKG that was done today after she was able to see the results in her mychart. Pt compared today's EKG with the last one that was done and has concerns about some things that were different on today's versus the recent ones.   Alexis patient is requesting a call from you to further discuss things.

## 2024-01-02 NOTE — Patient Instructions (Addendum)
 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 This is not a sexually transmitted infection.  Sexual partners do not need to be treated, however abstaining from sex or using condoms may prevent recurrence of the overgrowth. Some women have a recurrence of the overgrowth even when fully treated.  Call the office if your symptoms begin again.  Do not douche.  This is associated with decreased cure rates and more bacterial overgrowths. Take the full course of the antibiotic prescribed to you even if you begin to feel better. Do not drink alcohol with the antibiotic flagyl  (metronidazole ) as this drug will cause nausea and severe vomiting if you drink while taking antibiotic.    Take the Diflucan  (fluconazole ) once you have finished the Flagyl .      Counseling and Mental Health Resources   [facility]  - For Women and Girls only - Cost based upon sliding scale of income - Financial Aid available  [phone] [address] [place], [place] [address] [facility]  - Mental Health, Substance Abuse Treatment - IV Ketamine, Hydration and Weight Loss Programs - Center for Treatment for Resistant Depression and Suicidal Ideation  [address] [place], [place] [address]  [phone] [email or web address]   [facility]  - Individual and Family Counseling - Assessments and Therapy for Learning Disabilities, ADHD, Autism Spectrum Disorder, Processing Deficits  [phone] [address] [place], [place] [address]  [facility]  [address] [place], [place] [address]  [phone]  [facility]  - Individual, Family, Play and Group Therapy - In person and telehealth sessions available  Phone: [phone] Email: [email or web address]  [place] Location:   [address]  [place] [address] [place], [place] [address]   [place] Location:   [address] [place], [place] [address] Denman Rose MA Clinical Psychology  [address] [place] [address]  [phone]  [facility], [facility]  Adult, Adolescent and [facility]  [address] [place], [place] [address]  Text:  [phone]   Call:  [phone] Email: [email or web address] [facility] - [place] [facility]    [facility]  - Individual, Group Therapy - Day Programs, Wellness Coaching - Staff Programming, Workshops  [address], [place], [place] [address]  [phone]  [facility], [facility]  - Children, Adolescent, Adult and Family Therapy  [place] Location [phone]  [address] [place], [place]  [address]   [place] Location [phone]  [address] [place], [place]  [address]   [facility]ation  - Indivudual Counseling, [facility] [address] [place], [place] [address]  [phone] [facility]  -Services at less than a basic fee sponsored by [facility]  [address] [place], [place] [address]  [phone]   [facility]  Counseling offered by Psychology Doctoral Students  - Majority of Patients qualify for financial assistance   [address] [place], [place] [address]  [phone]
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
## 2024-01-07 NOTE — Telephone Encounter (Signed)
Alexis called pt and further discussed everything with pt that she had concerns about.

## 2024-02-04 ENCOUNTER — Ambulatory Visit (INDEPENDENT_AMBULATORY_CARE_PROVIDER_SITE_OTHER): Admitting: Student

## 2024-02-04 ENCOUNTER — Other Ambulatory Visit (HOSPITAL_BASED_OUTPATIENT_CLINIC_OR_DEPARTMENT_OTHER): Payer: Self-pay

## 2024-02-04 VITALS — BP 114/62 | HR 82 | Ht 69.0 in | Wt 255.6 lb

## 2024-02-04 DIAGNOSIS — F411 Generalized anxiety disorder: Secondary | ICD-10-CM

## 2024-02-04 DIAGNOSIS — F410 Panic disorder [episodic paroxysmal anxiety] without agoraphobia: Secondary | ICD-10-CM

## 2024-02-04 DIAGNOSIS — L409 Psoriasis, unspecified: Secondary | ICD-10-CM | POA: Diagnosis not present

## 2024-02-04 DIAGNOSIS — N760 Acute vaginitis: Secondary | ICD-10-CM

## 2024-02-04 DIAGNOSIS — R894 Abnormal immunological findings in specimens from other organs, systems and tissues: Secondary | ICD-10-CM

## 2024-02-04 DIAGNOSIS — R7303 Prediabetes: Secondary | ICD-10-CM | POA: Diagnosis not present

## 2024-02-04 DIAGNOSIS — J302 Other seasonal allergic rhinitis: Secondary | ICD-10-CM

## 2024-02-04 DIAGNOSIS — B9689 Other specified bacterial agents as the cause of diseases classified elsewhere: Secondary | ICD-10-CM

## 2024-02-04 LAB — POCT GLYCOSYLATED HEMOGLOBIN (HGB A1C): HbA1c, POC (prediabetic range): 6.3 % (ref 5.7–6.4)

## 2024-02-04 MED ORDER — FLUOXETINE HCL 10 MG PO CAPS
10.0000 mg | ORAL_CAPSULE | Freq: Every day | ORAL | 3 refills | Status: DC
  Filled 2024-02-04: qty 30, 30d supply, fill #0

## 2024-02-04 MED ORDER — METRONIDAZOLE 0.75 % VA GEL
VAGINAL | 3 refills | Status: DC
  Filled 2024-02-04: qty 70, 34d supply, fill #0

## 2024-02-04 MED ORDER — TRIAMCINOLONE ACETONIDE 0.1 % EX CREA
1.0000 | TOPICAL_CREAM | Freq: Two times a day (BID) | CUTANEOUS | 0 refills | Status: DC
  Filled 2024-02-04: qty 30, 15d supply, fill #0

## 2024-02-04 MED ORDER — CETIRIZINE HCL 10 MG PO TABS
10.0000 mg | ORAL_TABLET | Freq: Every day | ORAL | 3 refills | Status: AC
  Filled 2024-02-04: qty 30, 30d supply, fill #0

## 2024-02-04 MED ORDER — VALACYCLOVIR HCL 500 MG PO TABS
500.0000 mg | ORAL_TABLET | Freq: Two times a day (BID) | ORAL | 0 refills | Status: AC
  Filled 2024-02-04: qty 28, 14d supply, fill #0

## 2024-02-04 NOTE — Progress Notes (Unsigned)
    SUBJECTIVE:   CHIEF COMPLAINT / HPI:   Ann [name] is a 28 y.o. female  presenting for follow-up of chronic BV, bilateral foot pain, right thumb pain, follow-up of prediabetes and psoriasis of scalp.  Patient has had chronic BV for several years.  She reports completing metronidazole antibiotic course every 2 to 3 months.  She reports she just recently completed her last course of metronidazole and she is still having abnormal smell.  She reports having bilateral bottom of her foot pain in the mornings that slowly gets better throughout the day.  It is tight feeling and when she stretches it improves.  She reports having tightness in her thumb and pain.  She works in Recruitment consultant at [employer].  She uses her hands a lot at work.  Diagnosed history of prediabetes.  Patient reports she has been consuming a lot of honey and is worried about her A1c checked at this visit.  She has psoriasis of the scalp and would like to have the steroid cream refilled to treat this.  PERTINENT  PMH / PSH: Reviewed and updated   OBJECTIVE:   BP 114/62   Pulse 82   Ht 5\' 9"  (1.753 m)   Wt 255 lb 9.6 oz (115.9 kg)   LMP 01/31/2024   SpO2 99%   BMI 37.75 kg/m   Well-appearing, no acute distress Cardio: Regular rate, regular rhythm, no murmurs on exam. Pulm: Clear, no wheezing, no crackles. No increased work of breathing Abdominal: bowel sounds present, soft, non-tender, non-distended Extremities: no peripheral edema       02/04/2024    4:01 PM 01/02/2024   10:57 AM 11/05/2023    1:37 PM  PHQ9 SCORE ONLY  PHQ-9 Total Score 0 9 3      ASSESSMENT/PLAN:   Psoriasis of scalp Refill triamcinolone cream  BV (bacterial vaginosis) Chronic BV.  Prescribing course of metronidazole vaginal gel to be used 2-3 times a week for the next 62-months.  If patient returns to remission she can start boric acid vaginal suppositories  Prediabetes A1c checked today and is elevated from 6.1-6.3.   Instructed patient to cut back on amount of sweets she is consuming.  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  Also provided series of stretches she can do to help treat this pain.  Glendale Chard, DO [facility]

## 2024-02-04 NOTE — Patient Instructions (Addendum)
 For your pain you can try several over the counter topical medications. Topical medications are a great option to treat pain as you can place the medication right where you are hurting and they have less side effects.   Voltaren Gel/Diclofenac Gel: this is an anti-inflammatory cream (same ingredient as Motrin/Ibuprofen/Aleve) can be applied up to 4 times per day.    For the plantar fasciitis I recommend to use this brace    For your BV I am prescribing a metronidazole intravaginal gel that you use twice a week for the next 4 to 6 months

## 2024-02-05 ENCOUNTER — Encounter: Payer: Self-pay | Admitting: Student

## 2024-02-05 LAB — LIPID PANEL
Chol/HDL Ratio: 4.2 ratio (ref 0.0–4.4)
Cholesterol, Total: 163 mg/dL (ref 100–199)
HDL: 39 mg/dL — ABNORMAL LOW (ref >39)
LDL Chol Calc (NIH): 94 mg/dL (ref 0–99)
Triglycerides: 171 mg/dL — ABNORMAL HIGH (ref 0–149)
VLDL Cholesterol Cal: 30 mg/dL (ref 5–40)

## 2024-02-05 NOTE — Assessment & Plan Note (Signed)
 Chronic BV.  Prescribing course of metronidazole vaginal gel to be used 2-3 times a week for the next 13-months.  If patient returns to remission she can start boric acid vaginal suppositories

## 2024-02-05 NOTE — Assessment & Plan Note (Signed)
 A1c checked today and is elevated from 6.1-6.3.  Instructed patient to cut back on amount of sweets she is consuming.  Lipid panel obtained today

## 2024-02-05 NOTE — Assessment & Plan Note (Signed)
 Refilled Valtrex

## 2024-02-05 NOTE — Assessment & Plan Note (Signed)
Refill triamcinolone cream

## 2024-04-28 ENCOUNTER — Other Ambulatory Visit (HOSPITAL_BASED_OUTPATIENT_CLINIC_OR_DEPARTMENT_OTHER): Payer: Self-pay

## 2024-04-28 ENCOUNTER — Other Ambulatory Visit: Payer: Self-pay

## 2024-04-28 ENCOUNTER — Ambulatory Visit: Payer: Medicaid Other | Admitting: Dermatology

## 2024-04-28 ENCOUNTER — Encounter: Payer: Self-pay | Admitting: Dermatology

## 2024-04-28 VITALS — BP 124/66

## 2024-04-28 DIAGNOSIS — L219 Seborrheic dermatitis, unspecified: Secondary | ICD-10-CM | POA: Diagnosis not present

## 2024-04-28 MED ORDER — FLUOCINOLONE ACETONIDE SCALP 0.01 % EX OIL
TOPICAL_OIL | CUTANEOUS | 3 refills | Status: AC
Start: 2024-04-28 — End: ?
  Filled 2024-04-28: qty 118.28, 30d supply, fill #0

## 2024-04-28 MED ORDER — KETOCONAZOLE 2 % EX SHAM
1.0000 | MEDICATED_SHAMPOO | CUTANEOUS | 4 refills | Status: AC
  Filled 2024-04-28: qty 120, 34d supply, fill #0
  Filled 2024-07-31: qty 120, 34d supply, fill #1

## 2024-04-28 MED ORDER — FLUCONAZOLE 150 MG PO TABS
150.0000 mg | ORAL_TABLET | Freq: Every day | ORAL | 0 refills | Status: AC
  Filled 2024-04-28: qty 3, 3d supply, fill #0

## 2024-04-28 NOTE — Patient Instructions (Addendum)
 Date: Tue Apr 28 2024  Ann [name],  Thank you for visiting today. Here is a summary of the key instructions:  - Medications:   - Take fluconazole : 1 tablet for 3 days   - Use ketoconazole shampoo:     - Twice a week for 4 weeks     - Then once a week or every two weeks to prevent flare-ups   - Apply Dermasmooth FS Oil:     - Use for a few hours before washing out   - Use clobetasol  as needed for itching until symptoms clear  - Treatment Plan:   - Avoid oiling the scalp, as it can make the condition worse   - Be aware that stress, weather changes, and hormonal changes can trigger flares  - Follow-up:   - Return for a follow-up appointment in 6 months   - Come back sooner if needed and to discuss HS  - Pharmacy:   - Pick up prescriptions from your pharmacy   - You will have 6 refills for the oil and shampoo  Please reach out if you have any questions or concerns.  Best Regards,  Dr. Louana Roup [facility]      Important Information  Due to recent changes in healthcare laws, you may see results of your pathology and/or laboratory studies on MyChart before the doctors have had a chance to review them. We understand that in some cases there may be results that are confusing or concerning to you. Please understand that not all results are received at the same time and often the doctors may need to interpret multiple results in order to provide you with the best plan of care or course of treatment. Therefore, we ask that you please give us  2 business days to thoroughly review all your results before contacting the office for clarification. Should we see a critical lab result, you will be contacted sooner.   If You Need Anything After Your Visit  If you have any questions or concerns for your doctor, please call our main line at [phone] If no one answers, please leave a voicemail as directed and we will return your call as soon as possible. Messages left after 4 pm  will be answered the following business day.   You may also send us  a message via MyChart. We typically respond to MyChart messages within 1-2 business days.  For prescription refills, please ask your pharmacy to contact our office. Our fax number is [phone].  If you have an urgent issue when the clinic is closed that cannot wait until the next business day, you can page your doctor at the number below.    Please note that while we do our best to be available for urgent issues outside of office hours, we are not available 24/7.   If you have an urgent issue and are unable to reach us , you may choose to seek medical care at your doctor's office, retail clinic, urgent care center, or emergency room.  If you have a medical emergency, please immediately call 911 or go to the emergency department. In the event of inclement weather, please call our main line at [phone] for an update on the status of any delays or closures.  Dermatology Medication Tips: Please keep the boxes that topical medications come in in order to help keep track of the instructions about where and how to use these. Pharmacies typically print the medication instructions only on the boxes and not directly on the  medication tubes.   If your medication is too expensive, please contact our office at [phone] or send us  a message through MyChart.   We are unable to tell what your co-pay for medications will be in advance as this is different depending on your insurance coverage. However, we may be able to find a substitute medication at lower cost or fill out paperwork to get insurance to cover a needed medication.   If a prior authorization is required to get your medication covered by your insurance company, please allow us  1-2 business days to complete this process.  Drug prices often vary depending on where the prescription is filled and some pharmacies may offer cheaper prices.  The website www.goodrx.com  contains coupons for medications through different pharmacies. The prices here do not account for what the cost may be with help from insurance (it may be cheaper with your insurance), but the website can give you the price if you did not use any insurance.  - You can print the associated coupon and take it with your prescription to the pharmacy.  - You may also stop by our office during regular business hours and pick up a GoodRx coupon card.  - If you need your prescription sent electronically to a different pharmacy, notify our office through [facility] or by phone at [phone]

## 2024-04-28 NOTE — Progress Notes (Signed)
   New Patient Visit   Subjective  Ann [name] is a 28 y.o. female who presents for the following: New Pt - Psoriasis  Patient states she has psoraisis located at the scalp that she would like to have examined. Patient reports the areas have been there for her entire upbringing but has gotten worse over the last 2 years.  She reports the areas are bothersome. Patient rates irritation 10 out of 10. She states that the areas have not spread. Patient reports she has previously been treated for these areas by PCP was Rx TMC 0.1% which did not relieve Sx & Clobetasol  solution which worked but pt need refill as its been 2 years since she has any. Patient has not tried any OTC shampoos or products. Patient denied Hx of bx. Patient denied family history of skin cancer(s).   The following portions of the chart were reviewed this encounter and updated as appropriate: medications, allergies, medical history  Review of Systems:  No other skin or systemic complaints except as noted in HPI or Assessment and Plan.  Objective  Well appearing patient in no apparent distress; mood and affect are within normal limits.   A focused examination was performed of the following areas: scalp   Relevant exam findings are noted in the Assessment and Plan.             Assessment & Plan   1. Severe Seborrheic Dermatitis - Assessment: Patient diagnosed with seborrheic dermatitis, previously misdiagnosed as psoriasis. Condition characterized by yeast overgrowth on skin and scalp, feeding on natural oils. Symptoms include redness, itchiness, and thick, greasy flakes. Previous treatment with clobetasol  solution two years ago provided minimal relief. Current use of triamcinolone  ineffective. Condition exacerbated by oiling scalp and triggered by stress, weather changes, and hormonal fluctuations.  - Plan:    Prescribe fluconazole  1 tablet for 3 days    Prescribe ketoconazole shampoo to be used twice weekly  for 4 weeks, then once weekly or every two weeks for maintenance    Prescribe Dermasmooth FS Oil to be applied for a few hours before washing out    Prescribe clobetasol  as a rescue medication for itch relief until condition clears     Educate patient on triggers for flares including stress, weather changes, and hormonal changes    Schedule follow-up appointment in 6 months or sooner if needed    No follow-ups on file.    Documentation: I have reviewed the above documentation for accuracy and completeness, and I agree with the above.   I, Shirron Louanne Roussel, CMA, am acting as scribe for [employer], DO.   Louana Roup, DO

## 2024-07-31 ENCOUNTER — Other Ambulatory Visit (HOSPITAL_BASED_OUTPATIENT_CLINIC_OR_DEPARTMENT_OTHER): Payer: Self-pay

## 2024-07-31 ENCOUNTER — Encounter: Payer: Self-pay | Admitting: Pharmacist

## 2024-07-31 ENCOUNTER — Other Ambulatory Visit: Payer: Self-pay

## 2024-08-04 ENCOUNTER — Other Ambulatory Visit (HOSPITAL_BASED_OUTPATIENT_CLINIC_OR_DEPARTMENT_OTHER): Payer: Self-pay

## 2024-09-03 ENCOUNTER — Ambulatory Visit: Admitting: Student

## 2024-09-04 ENCOUNTER — Other Ambulatory Visit (HOSPITAL_BASED_OUTPATIENT_CLINIC_OR_DEPARTMENT_OTHER): Payer: Self-pay

## 2024-09-04 ENCOUNTER — Other Ambulatory Visit (HOSPITAL_COMMUNITY)
Admission: RE | Admit: 2024-09-04 | Discharge: 2024-09-04 | Disposition: A | Discharge status: Home / Self Care | Payer: Self-pay | Source: Ambulatory Visit | Attending: Family Medicine | Admitting: Family Medicine

## 2024-09-04 ENCOUNTER — Ambulatory Visit (INDEPENDENT_AMBULATORY_CARE_PROVIDER_SITE_OTHER): Payer: Self-pay | Admitting: Family Medicine

## 2024-09-04 VITALS — BP 92/54 | HR 97 | Ht 69.0 in | Wt 295.4 lb

## 2024-09-04 DIAGNOSIS — N898 Other specified noninflammatory disorders of vagina: Secondary | ICD-10-CM | POA: Insufficient documentation

## 2024-09-04 DIAGNOSIS — Z113 Encounter for screening for infections with a predominantly sexual mode of transmission: Secondary | ICD-10-CM | POA: Insufficient documentation

## 2024-09-04 DIAGNOSIS — Z3200 Encounter for pregnancy test, result unknown: Secondary | ICD-10-CM

## 2024-09-04 LAB — POCT WET PREP (WET MOUNT)
Clue Cells Wet Prep Whiff POC: NEGATIVE
Trichomonas Wet Prep HPF POC: ABSENT

## 2024-09-04 LAB — POCT URINE PREGNANCY: Preg Test, Ur: NEGATIVE

## 2024-09-04 MED ORDER — FLUCONAZOLE 150 MG PO TABS
150.0000 mg | ORAL_TABLET | Freq: Once | ORAL | 0 refills | Status: AC
  Filled 2024-09-04: qty 2, 3d supply, fill #0

## 2024-09-04 NOTE — Progress Notes (Signed)
    SUBJECTIVE:   CHIEF COMPLAINT / HPI:   STI screening White clumpy discharge, itching for several days. No dysuria or frequency.  Also requests pregnancy test.  PERTINENT  PMH / PSH: Reviewed.  OBJECTIVE:   BP (!) 92/54   Pulse 97   Ht 5' 9 (1.753 m)   Wt 295 lb 6.4 oz (134 kg)   LMP 08/18/2024   SpO2 99%   BMI 43.62 kg/m   General: well-appearing, pleasant, no acute distress. HEENT: normocephalic, PERRLA, EOM grossly intact, MMM. Pulm: No increased work of breathing. Abdominal: soft, non-tender, non-distended. Extremities: Moves all extremities equally. Neuro: Alert and oriented x3, speech normal in content, no facial asymmetry Psych:  Cognition and judgment appear intact.  GU Exam:  External exam: Normal-appearing female external genitalia.   Vaginal exam notable for copious clumpy white discharge.  Cervix without discharge or obvious lesion.  Chaperoned exam, CMA [place].    ASSESSMENT/PLAN:   Assessment & Plan Complaint of vaginal discharge Swabs collected today. Will let her know results and send in medications as indicated. - wet prep today with yeast - fluconazole  sent to pharmacy - also urine pregnancy, blood work for HIV/RPR  Lauraine Norse, DO [facility]

## 2024-09-04 NOTE — Patient Instructions (Addendum)
 It was so good to see you today! Thank you for allowing me to take care of you.  Today we discussed the following concerns and plans:  Vaginal itching/discharge - I will let you know the results of your tests when they are available.  If you have any concerns, please call the clinic or schedule an appointment.  It was a pleasure to take care of you today. Be well!  Lauraine Norse, DO [facility], PGY-2  Do you need your medications delivered to your home?   We'll send your prescription to the [facility] for delivery.          Address: [address] [place], [place], [place] [address]          Phone: [phone]  Please call the [facility] to speak with a pharmacist and set up your home medication delivery. If you have any questions, feel free to contact us  -- we're happy to help!  Other [place] [facility]acies that offer affordable prices on both prescriptions and over-the-counter items, as well as convenient services like vaccinations, are  [facility], at [facility]         Address:  [address], [place], [place] [address]         Phone: [phone]  [facility], located in the [facility]        Address: [address], [place], [place] [address]        Phone: [phone]  [facility], at [facility]       Address: [address], [place], [place] [address]       Phone: [phone]  [facility], at [facility]       Address: [address], [place], [place] [address]       Phone: [phone]

## 2024-09-05 LAB — HIV ANTIBODY (ROUTINE TESTING W REFLEX): HIV Screen 4th Generation wRfx: NONREACTIVE

## 2024-09-05 LAB — RPR: RPR Ser Ql: NONREACTIVE

## 2024-09-07 ENCOUNTER — Ambulatory Visit: Payer: Self-pay | Admitting: Family Medicine

## 2024-09-08 ENCOUNTER — Other Ambulatory Visit (HOSPITAL_BASED_OUTPATIENT_CLINIC_OR_DEPARTMENT_OTHER): Payer: Self-pay

## 2024-09-08 LAB — CERVICOVAGINAL ANCILLARY ONLY
Bacterial Vaginitis (gardnerella): POSITIVE — AB
Candida Glabrata: NEGATIVE
Candida Vaginitis: POSITIVE — AB
Chlamydia: NEGATIVE
Chlamydia: NEGATIVE
Comment: NEGATIVE
Comment: NORMAL
Comment: NEGATIVE
Comment: NEGATIVE
Comment: NEGATIVE
Comment: NEGATIVE
Comment: NEGATIVE
Comment: NORMAL
Neisseria Gonorrhea: NEGATIVE
Neisseria Gonorrhea: NEGATIVE
Trichomonas: NEGATIVE

## 2024-09-08 MED ORDER — METRONIDAZOLE 500 MG PO TABS
500.0000 mg | ORAL_TABLET | Freq: Two times a day (BID) | ORAL | 0 refills | Status: AC
  Filled 2024-09-08: qty 14, 7d supply, fill #0

## 2024-09-18 ENCOUNTER — Encounter: Payer: Self-pay | Admitting: Family Medicine

## 2024-09-18 ENCOUNTER — Ambulatory Visit: Payer: Self-pay | Admitting: Family Medicine

## 2024-09-18 VITALS — BP 131/84 | HR 92 | Ht 69.0 in | Wt 284.0 lb

## 2024-09-18 DIAGNOSIS — R350 Frequency of micturition: Secondary | ICD-10-CM

## 2024-09-18 DIAGNOSIS — R631 Polydipsia: Secondary | ICD-10-CM

## 2024-09-18 DIAGNOSIS — E119 Type 2 diabetes mellitus without complications: Secondary | ICD-10-CM

## 2024-09-18 LAB — POCT GLYCOSYLATED HEMOGLOBIN (HGB A1C): Hemoglobin A1C: 10.9 % — AB (ref 4.0–5.6)

## 2024-09-18 LAB — GLUCOSE, POCT (MANUAL RESULT ENTRY): POC Glucose: 324 mg/dL — AB (ref 70–99)

## 2024-09-18 MED ORDER — PEN NEEDLES 32G X 4 MM MISC: 1.0000 | Freq: Every day | 2 refills | Status: AC

## 2024-09-18 MED ORDER — BLOOD GLUCOSE TEST VI STRP: 1.0000 | ORAL_STRIP | 0 refills | Status: AC

## 2024-09-18 MED ORDER — LANCETS MISC
1.0000 | 0 refills | Status: AC
Start: 2024-09-18 — End: ?

## 2024-09-18 MED ORDER — BLOOD GLUCOSE MONITORING SUPPL DEVI: 1.0000 | 0 refills | Status: AC

## 2024-09-18 MED ORDER — LANCET DEVICE MISC
1.0000 | 0 refills | Status: AC
Start: 2024-09-18 — End: ?

## 2024-09-18 MED ORDER — INSULIN GLARGINE 100 UNIT/ML SOLOSTAR PEN
10.0000 [IU] | PEN_INJECTOR | Freq: Every day | SUBCUTANEOUS | 11 refills | Status: AC
  Filled 2024-09-21: qty 9, 90d supply, fill #0

## 2024-09-18 NOTE — Patient Instructions (Addendum)
 Thank you for visiting clinic today and allowing us  to participate in your care!  We discussed the new diagnosis of diabetes today and starting insulin for treatment.   Please give yourself 10 units of insulin at night every night. Please check your blood sugar 3-4 times per day.   How Insulin Works Insulin is a medicine you inject under your skin. It helps your body use sugar for energy and keeps your blood sugar in a safe range.   Check your blood sugar regularly.   Take insulin as directed. Use the right technique for injections. If you have trouble, ask for help or use devices like insulin pens.  Eat healthy and stay active. Good nutrition and exercise help insulin work better.  Learn about your insulin. Know when to take it, how to store it, and what to do if you miss a dose.  Watch Out for Low Blood Sugar (Hypoglycemia) Insulin can sometimes make your blood sugar too low. Signs of low blood sugar include feeling shaky, sweaty, confused, dizzy, or very hungry. If this happens:  Eat or drink something with sugar right away (like juice, regular soda, or glucose tablets).  Check your blood sugar again after 15 minutes.  If you do not feel better or your blood sugar stays low, get medical help.  Return Precautions: When to Call or Seek Help Contact your healthcare provider or go to the emergency room if:  You have severe low blood sugar and cannot treat it yourself.  You pass out or have a seizure.  You have very high blood sugar (over 300 mg/dL) and feel sick, confused, or are vomiting.  You notice ketones in your urine or breath (fruity smell), especially if you feel weak or have stomach pain.  You have trouble using your insulin, or your blood sugar stays too high or too low despite following instructions.    Please return to clinic on Monday for follow up and lab work.  Future Appointments  Date Time Provider Department Center  09/21/2024  9:30 AM ACCESS TO CARE POOL [facility]  [facility]  10/28/2024 11:15 AM Alm Delon SAILOR, DO [facility] None    If your symptoms worsen in the meantime or you experience the warning signs above, please be seen in the ED.  Reach out any time with any questions or concerns you may have - we are here for you!  Damien Cassis, MD [facility] [phone]

## 2024-09-18 NOTE — Progress Notes (Signed)
    SUBJECTIVE:   CHIEF COMPLAINT / HPI:   Increased urination and thirst Over the past month and a half, patient has had increased urination and thirst.  She notes getting up 4 times a night to urinate, with about 2 minutes of vision blurriness when she gets up in the middle of the night.  No dizziness, no falls.  She feels lightheaded in the morning about every other day.  She threw up on Monday, this was the only episode of vomiting.  Feels nauseous though she has otherwise been able to eat and drink without issue.  She notes having diarrhea about every other day.  Her maternal uncle has diabetes, no other known family history of diabetes.  PERTINENT  PMH / PSH: HSV  OBJECTIVE:   BP 131/84   Pulse 92   Ht 5' 9 (1.753 m)   Wt 284 lb (128.8 kg)   LMP 08/18/2024   SpO2 100%   BMI 41.94 kg/m   General: Well-appearing, no acute distress. Resting comfortably in room. CV: Normal S1/S2. No extra heart sounds. Warm and well-perfused. Pulm: Breathing comfortably on room air. CTAB. No increased WOB. Abd: Soft, non-tender, non-distended. Skin:  Warm, dry. Cap refill <2s.  Psych: Pleasant and appropriate.    ASSESSMENT/PLAN:   Assessment & Plan Increased thirst Increased frequency of urination Diabetes mellitus without complication (HCC) POC BG 324 with A1c 10.9 in clinic today.  Discussed in detail regarding new diabetes diagnosis.  Given symptoms of N/V/D and elevated blood sugar, some concern for possible DKA though patient reassuringly without dizziness or confusion and appears well-hydrated on exam.  Discussed this concern with patient.  Unfortunately unable to obtain lab work given end of business day.  Through shared decision making, patient will return home this weekend with plan to begin insulin regimen, 10 units nightly.  If patient symptoms worsen, patient to seek care at the ED.  Emergency precautions for diabetes and insulin use discussed.  Insulin and other diabetes supplies  sent to pharmacy.  Discussed diabetes education and provided resources.  Follow-up appointment made for this Monday, 10/27.  Plan for checking lab work at this time.   Future Appointments  Date Time Provider Department Center  09/21/2024  3:50 PM ACCESS TO CARE POOL [facility]  10/28/2024 11:15 AM Alm Delon SAILOR, DO [facility] None    Damien Cassis, MD [facility]

## 2024-09-19 ENCOUNTER — Other Ambulatory Visit: Payer: Self-pay

## 2024-09-19 ENCOUNTER — Emergency Department (HOSPITAL_BASED_OUTPATIENT_CLINIC_OR_DEPARTMENT_OTHER)
Admission: EM | Admit: 2024-09-19 | Discharge: 2024-09-19 | Disposition: A | Discharge status: Home / Self Care | Payer: MEDICAID | Attending: Emergency Medicine | Admitting: Emergency Medicine

## 2024-09-19 ENCOUNTER — Encounter (HOSPITAL_BASED_OUTPATIENT_CLINIC_OR_DEPARTMENT_OTHER): Payer: Self-pay

## 2024-09-19 DIAGNOSIS — Z794 Long term (current) use of insulin: Secondary | ICD-10-CM | POA: Insufficient documentation

## 2024-09-19 DIAGNOSIS — E860 Dehydration: Secondary | ICD-10-CM | POA: Insufficient documentation

## 2024-09-19 DIAGNOSIS — R739 Hyperglycemia, unspecified: Secondary | ICD-10-CM

## 2024-09-19 DIAGNOSIS — E119 Type 2 diabetes mellitus without complications: Secondary | ICD-10-CM | POA: Insufficient documentation

## 2024-09-19 LAB — CBC
HCT: 38.4 % (ref 36.0–46.0)
Hemoglobin: 12.7 g/dL (ref 12.0–15.0)
MCH: 26.6 pg (ref 26.0–34.0)
MCHC: 33.1 g/dL (ref 30.0–36.0)
MCV: 80.3 fL (ref 80.0–100.0)
Platelets: 378 K/uL (ref 150–400)
RBC: 4.78 MIL/uL (ref 3.87–5.11)
RDW: 13.2 % (ref 11.5–15.5)
WBC: 6.3 K/uL (ref 4.0–10.5)
nRBC: 0 % (ref 0.0–0.2)

## 2024-09-19 LAB — COMPREHENSIVE METABOLIC PANEL WITH GFR
ALT: 55 U/L — ABNORMAL HIGH (ref 0–44)
AST: 46 U/L — ABNORMAL HIGH (ref 15–41)
Albumin: 4.1 g/dL (ref 3.5–5.0)
Alkaline Phosphatase: 94 U/L (ref 38–126)
Anion gap: 12 (ref 5–15)
BUN: 6 mg/dL (ref 6–20)
CO2: 24 mmol/L (ref 22–32)
Calcium: 9.5 mg/dL (ref 8.9–10.3)
Chloride: 100 mmol/L (ref 98–111)
Creatinine, Ser: 0.62 mg/dL (ref 0.44–1.00)
GFR, Estimated: >60 mL/min (ref >60)
Glucose, Bld: 282 mg/dL — ABNORMAL HIGH (ref 70–99)
Potassium: 4.1 mmol/L (ref 3.5–5.1)
Sodium: 135 mmol/L (ref 135–145)
Total Bilirubin: 0.4 mg/dL (ref 0.0–1.2)
Total Protein: 7.4 g/dL (ref 6.5–8.1)

## 2024-09-19 LAB — BETA-HYDROXYBUTYRIC ACID: Beta-Hydroxybutyric Acid: 1.26 mmol/L — ABNORMAL HIGH (ref 0.05–0.27)

## 2024-09-19 LAB — CBG MONITORING, ED
Glucose-Capillary: 270 mg/dL — ABNORMAL HIGH (ref 70–99)
Glucose-Capillary: 200 mg/dL — ABNORMAL HIGH (ref 70–99)

## 2024-09-19 MED ORDER — LACTATED RINGERS IV BOLUS
1000.0000 mL | Freq: Once | INTRAVENOUS | Status: AC
  Administered 2024-09-19: 1000 mL via INTRAVENOUS

## 2024-09-19 MED ORDER — INSULIN ASPART 100 UNIT/ML IJ SOLN
8.0000 [IU] | Freq: Once | INTRAMUSCULAR | Status: AC
  Administered 2024-09-19: 8 [IU] via SUBCUTANEOUS

## 2024-09-19 NOTE — ED Provider Notes (Signed)
 [place] EMERGENCY DEPARTMENT AT [facility] Provider Note   CSN: [id number] Arrival date & time: 09/19/24  0725     Patient presents with: Dizziness and Hyperglycemia   Ann Lafon is a 28 y.o. female.   Pt with c/o recent diagnosis diabetes yesterday. States was suggested could go to ED to get complete labs done. Indicates polyuria and polydipsia in past 1-2 months. No dysuria. No fever or chills. Pcp yesterday started on insulin but has not yet taken. No wt loss. No vomiting or diarrhea. Mild lightheadedness this AM. No room spinning or ataxia. No numbness/weakness. No headache. No fever or chills.   The history is provided by the patient, medical records and a relative.  Dizziness Associated symptoms: no chest pain, no diarrhea, no headaches, no shortness of breath, no vomiting and no weakness   Hyperglycemia Associated symptoms: dizziness, increased thirst and polyuria   Associated symptoms: no abdominal pain, no chest pain, no dysuria, no fever, no shortness of breath, no vomiting and no weakness        Prior to Admission medications   Medication Sig Start Date End Date Taking? Authorizing Provider  Blood Glucose Monitoring Suppl DEVI 1 each by Does not apply route as directed. Dispense based on patient and insurance preference. Use up to four times daily as directed. (FOR ICD-10 E10.9, E11.9). 09/18/24   Diona Perkins, MD  cetirizine  (ZYRTEC  ALLERGY) 10 MG tablet Take 1 tablet (10 mg total) by mouth daily. 02/04/24   Cleotilde Perkins, DO  Fluocinolone  Acetonide Scalp (DERMA-SMOOTHE /FS SCALP) 0.01 % OIL Apply to scalp 2-3 hours prior to washing hair. Then rinse with ketoconazole  shampoo. 04/28/24   Alm Delon SAILOR, DO  Glucose Blood (BLOOD GLUCOSE TEST STRIPS) STRP 1 each by Does not apply route as directed. Dispense based on patient and insurance preference. Use up to four times daily as directed. (FOR ICD-10 E10.9, E11.9). 09/18/24   Diona Perkins, MD  insulin  glargine (LANTUS) 100 UNIT/ML Solostar Pen Inject 10 Units into the skin at bedtime. 09/18/24   Diona Perkins, MD  Insulin Pen Needle (PEN NEEDLES) 32G X 4 MM MISC 1 each by Does not apply route daily. 09/18/24   Donzetta Rollene BRAVO, MD  ketoconazole  (NIZORAL ) 2 % shampoo Apply to the affected area(s) topically 2 (two) times a week. 04/28/24   Alm Delon SAILOR, DO  Lancet Device MISC 1 each by Does not apply route as directed. Dispense based on patient and insurance preference. Use up to four times daily as directed. (FOR ICD-10 E10.9, E11.9). 09/18/24   Diona Perkins, MD  Lancets MISC 1 each by Does not apply route as directed. Dispense based on patient and insurance preference. Use up to four times daily as directed. (FOR ICD-10 E10.9, E11.9). 09/18/24   Diona Perkins, MD  valACYclovir  (VALTREX ) 500 MG tablet Take 1 tablet (500 mg total) by mouth 2 (two) times daily. 02/04/24   Cleotilde Perkins, DO    Allergies: Pollen extract    Review of Systems  Constitutional:  Negative for chills and fever.  HENT:  Negative for sore throat.   Eyes:  Negative for redness.  Respiratory:  Negative for cough and shortness of breath.   Cardiovascular:  Negative for chest pain and leg swelling.  Gastrointestinal:  Negative for abdominal pain, diarrhea and vomiting.  Endocrine: Positive for polydipsia and polyuria.  Genitourinary:  Negative for dysuria and flank pain.  Musculoskeletal:  Negative for back pain and neck pain.  Skin:  Negative for  rash.  Neurological:  Positive for dizziness. Negative for syncope, speech difficulty, weakness, numbness and headaches.    Updated Vital Signs BP 122/70   Pulse 71   Temp 98 F (36.7 C)   Resp 17   Ht 1.753 m (5' 9)   Wt 128.8 kg   LMP 08/18/2024   SpO2 99%   BMI 41.93 kg/m   Physical Exam Vitals and nursing note reviewed.  Constitutional:      Appearance: Normal appearance. She is well-developed.  HENT:     Head: Atraumatic.     Nose: Nose normal.      Mouth/Throat:     Mouth: Mucous membranes are moist.     Pharynx: Oropharynx is clear.  Eyes:     General: No scleral icterus.    Conjunctiva/sclera: Conjunctivae normal.     Pupils: Pupils are equal, round, and reactive to light.  Neck:     Trachea: No tracheal deviation.  Cardiovascular:     Rate and Rhythm: Normal rate and regular rhythm.     Pulses: Normal pulses.     Heart sounds: Normal heart sounds. No murmur heard.    No friction rub. No gallop.  Pulmonary:     Effort: Pulmonary effort is normal. No respiratory distress.     Breath sounds: Normal breath sounds.  Abdominal:     General: Bowel sounds are normal. There is no distension.     Palpations: Abdomen is soft. There is no mass.     Tenderness: There is no abdominal tenderness. There is no guarding.  Genitourinary:    Comments: No cva tenderness.  Musculoskeletal:        General: No swelling or tenderness.     Cervical back: Normal range of motion and neck supple. No rigidity. No muscular tenderness.     Right lower leg: No edema.     Left lower leg: No edema.  Skin:    General: Skin is warm and dry.     Findings: No rash.  Neurological:     Mental Status: She is alert.     Comments: Alert, speech normal. Motor/sens grossly intact bil. Steady gait.   Psychiatric:        Mood and Affect: Mood normal.     (all labs ordered are listed, but only abnormal results are displayed) Results for orders placed or performed during the hospital encounter of 09/19/24  CBG monitoring, ED   Collection Time: 09/19/24  7:30 AM  Result Value Ref Range   Glucose-Capillary 270 (H) 70 - 99 mg/dL  CBC   Collection Time: 09/19/24  8:18 AM  Result Value Ref Range   WBC 6.3 4.0 - 10.5 K/uL   RBC 4.78 3.87 - 5.11 MIL/uL   Hemoglobin 12.7 12.0 - 15.0 g/dL   HCT 61.5 63.9 - 53.9 %   MCV 80.3 80.0 - 100.0 fL   MCH 26.6 26.0 - 34.0 pg   MCHC 33.1 30.0 - 36.0 g/dL   RDW 86.7 88.4 - 84.4 %   Platelets 378 150 - 400 K/uL   nRBC 0.0  0.0 - 0.2 %  Comprehensive metabolic panel with GFR   Collection Time: 09/19/24  8:18 AM  Result Value Ref Range   Sodium 135 135 - 145 mmol/L   Potassium 4.1 3.5 - 5.1 mmol/L   Chloride 100 98 - 111 mmol/L   CO2 24 22 - 32 mmol/L   Glucose, Bld 282 (H) 70 - 99 mg/dL   BUN 6 6 -  20 mg/dL   Creatinine, Ser 9.37 0.44 - 1.00 mg/dL   Calcium 9.5 8.9 - 89.6 mg/dL   Total Protein 7.4 6.5 - 8.1 g/dL   Albumin 4.1 3.5 - 5.0 g/dL   AST 46 (H) 15 - 41 U/L   ALT 55 (H) 0 - 44 U/L   Alkaline Phosphatase 94 38 - 126 U/L   Total Bilirubin 0.4 0.0 - 1.2 mg/dL   GFR, Estimated >39 >39 mL/min   Anion gap 12 5 - 15  Beta-hydroxybutyric acid   Collection Time: 09/19/24  8:18 AM  Result Value Ref Range   Beta-Hydroxybutyric Acid 1.26 (H) 0.05 - 0.27 mmol/L  POC CBG, ED   Collection Time: 09/19/24 10:35 AM  Result Value Ref Range   Glucose-Capillary 200 (H) 70 - 99 mg/dL      EKG: None  Radiology: No results found.   Procedures   Medications Ordered in the ED  lactated ringers bolus 1,000 mL (0 mLs Intravenous Stopped 09/19/24 0946)  insulin aspart (novoLOG) injection 8 Units (8 Units Subcutaneous Given 09/19/24 0916)  lactated ringers bolus 1,000 mL (1,000 mLs Intravenous New Bag/Given 09/19/24 0953)                                    Medical Decision Making Problems Addressed: Dehydration: acute illness or injury Diabetes mellitus, new onset (HCC): acute illness or injury with systemic symptoms that poses a threat to life or bodily functions Hyperglycemia: acute illness or injury with systemic symptoms that poses a threat to life or bodily functions  Amount and/or Complexity of Data Reviewed Independent Historian:     Details: family External Data Reviewed: notes. Labs: ordered. Decision-making details documented in ED Course. ECG/medicine tests: ordered and independent interpretation performed. Decision-making details documented in ED Course.  Risk Prescription drug  management. Decision regarding hospitalization.   Iv ns. Continuous pulse ox and cardiac monitoring. Labs ordered/sent.   Differential diagnosis includes dka, hyperglycemia, dehydration, etc. Dispo decision including potential need for admission considered - will get labs and reassess.   Reviewed nursing notes and prior charts for additional history. External reports reviewed. Additional history from: family.  LR bolus.   Cardiac monitor: sinus rhythm, rate 96.  Labs reviewed/interpreted by me - glucose high, 282, hco3 normal. Wbc and hgb normal.   IVF bolus. Novolog sq.  Additional fluids, po and iv. Recheck cbg - improved.   Pt appears stable for ED d/c.   Rec close pcp f/u.  Return precautions provided.       Final diagnoses:  Hyperglycemia  Diabetes mellitus, new onset [facility])  Dehydration    ED Discharge Orders     None          Bernard Drivers, MD 09/19/24 1039

## 2024-09-19 NOTE — ED Triage Notes (Signed)
 Arrives ambulatory to the ED with complaints of dizziness and elevated glucose levels. Patient states that she has been newly diagnosed with diabetes this week after noting her blood sugar to be in the 400's. She is now having dizziness as well.

## 2024-09-19 NOTE — Discharge Instructions (Addendum)
 It was our pleasure to provide your ER care today - we hope that you feel better.  Drink plenty of water/stay well hydrated. Take insulin as prescribed by your doctor. Monitor your blood sugar 4x/day (before meals and at bedtime), and record values.   Follow up closely with primary care doctor in the coming week for recheck.  Return to ER if worse, new symptoms, fevers, new/severe pain, chest pain, trouble breathing, persistent vomiting, weak/fainting, or other concern.

## 2024-09-20 ENCOUNTER — Emergency Department (HOSPITAL_BASED_OUTPATIENT_CLINIC_OR_DEPARTMENT_OTHER)
Admission: EM | Admit: 2024-09-20 | Discharge: 2024-09-20 | Disposition: A | Discharge status: Home / Self Care | Payer: Self-pay | Attending: Emergency Medicine | Admitting: Emergency Medicine

## 2024-09-20 ENCOUNTER — Other Ambulatory Visit: Payer: Self-pay

## 2024-09-20 ENCOUNTER — Encounter (HOSPITAL_BASED_OUTPATIENT_CLINIC_OR_DEPARTMENT_OTHER): Payer: Self-pay

## 2024-09-20 DIAGNOSIS — E1165 Type 2 diabetes mellitus with hyperglycemia: Secondary | ICD-10-CM | POA: Insufficient documentation

## 2024-09-20 DIAGNOSIS — Z794 Long term (current) use of insulin: Secondary | ICD-10-CM | POA: Insufficient documentation

## 2024-09-20 DIAGNOSIS — R739 Hyperglycemia, unspecified: Secondary | ICD-10-CM

## 2024-09-20 LAB — BASIC METABOLIC PANEL WITH GFR
Anion gap: 12 (ref 5–15)
BUN: 6 mg/dL (ref 6–20)
CO2: 23 mmol/L (ref 22–32)
Calcium: 9.5 mg/dL (ref 8.9–10.3)
Chloride: 100 mmol/L (ref 98–111)
Creatinine, Ser: 0.65 mg/dL (ref 0.44–1.00)
GFR, Estimated: >60 mL/min (ref >60)
Glucose, Bld: 284 mg/dL — ABNORMAL HIGH (ref 70–99)
Potassium: 3.9 mmol/L (ref 3.5–5.1)
Sodium: 135 mmol/L (ref 135–145)

## 2024-09-20 LAB — CBC
HCT: 39.7 % (ref 36.0–46.0)
Hemoglobin: 12.9 g/dL (ref 12.0–15.0)
MCH: 26.2 pg (ref 26.0–34.0)
MCHC: 32.5 g/dL (ref 30.0–36.0)
MCV: 80.5 fL (ref 80.0–100.0)
Platelets: 402 K/uL — ABNORMAL HIGH (ref 150–400)
RBC: 4.93 MIL/uL (ref 3.87–5.11)
RDW: 13.1 % (ref 11.5–15.5)
WBC: 7.7 K/uL (ref 4.0–10.5)
nRBC: 0 % (ref 0.0–0.2)

## 2024-09-20 LAB — CBG MONITORING, ED
Glucose-Capillary: 279 mg/dL — ABNORMAL HIGH (ref 70–99)
Glucose-Capillary: 295 mg/dL — ABNORMAL HIGH (ref 70–99)
Glucose-Capillary: 269 mg/dL — ABNORMAL HIGH (ref 70–99)

## 2024-09-20 MED ORDER — SODIUM CHLORIDE 0.9 % IV BOLUS
1000.0000 mL | Freq: Once | INTRAVENOUS | Status: AC
  Administered 2024-09-20: 1000 mL via INTRAVENOUS

## 2024-09-20 MED ORDER — INSULIN ASPART 100 UNIT/ML IJ SOLN
5.0000 [IU] | Freq: Once | INTRAMUSCULAR | Status: AC
  Administered 2024-09-20: 5 [IU] via SUBCUTANEOUS

## 2024-09-20 NOTE — ED Provider Notes (Signed)
 [place] EMERGENCY DEPARTMENT AT [facility] Provider Note   CSN: [id number] Arrival date & time: 09/20/24  1245     Patient presents with: Hyperglycemia   Ann [name] is a 28 y.o. female.    Patient with recent diagnosis of diabetes presents to the emergency department today for evaluation of elevated blood sugar.  Patient was seen at family practice clinic last week for increased thirst.  She was found to have a A1c of 10.2%.  She was started on insulin glargine 10 units.  She has not appointment tomorrow to have labs, but was referred to the emergency department if symptoms worsen and she was seen yesterday.  Labs performed.  No evidence of DKA.  She was treated with IV fluids and subcu insulin.  This morning patient awoke with blood sugar in the 300s.  She has been unable to obtain her insulin from a pharmacy at this time.  She denies abdominal pain or vomiting.  She has had some cloudy thinking.  Continues having polydipsia and polyuria.  Has some generalized dizziness but no full syncope.  No chest pain or shortness of breath.  No infectious symptoms.       Prior to Admission medications   Medication Sig Start Date End Date Taking? Authorizing Provider  Blood Glucose Monitoring Suppl DEVI 1 each by Does not apply route as directed. Dispense based on patient and insurance preference. Use up to four times daily as directed. (FOR ICD-10 E10.9, E11.9). 09/18/24   Diona Perkins, MD  cetirizine  (ZYRTEC  ALLERGY) 10 MG tablet Take 1 tablet (10 mg total) by mouth daily. 02/04/24   Cleotilde Perkins, DO  Fluocinolone  Acetonide Scalp (DERMA-SMOOTHE /FS SCALP) 0.01 % OIL Apply to scalp 2-3 hours prior to washing hair. Then rinse with ketoconazole  shampoo. 04/28/24   Alm Delon SAILOR, DO  Glucose Blood (BLOOD GLUCOSE TEST STRIPS) STRP 1 each by Does not apply route as directed. Dispense based on patient and insurance preference. Use up to four times daily as directed. (FOR ICD-10  E10.9, E11.9). 09/18/24   Diona Perkins, MD  insulin glargine (LANTUS) 100 UNIT/ML Solostar Pen Inject 10 Units into the skin at bedtime. 09/18/24   Diona Perkins, MD  Insulin Pen Needle (PEN NEEDLES) 32G X 4 MM MISC 1 each by Does not apply route daily. 09/18/24   Donzetta Rollene BRAVO, MD  ketoconazole  (NIZORAL ) 2 % shampoo Apply to the affected area(s) topically 2 (two) times a week. 04/28/24   Alm Delon SAILOR, DO  Lancet Device MISC 1 each by Does not apply route as directed. Dispense based on patient and insurance preference. Use up to four times daily as directed. (FOR ICD-10 E10.9, E11.9). 09/18/24   Diona Perkins, MD  Lancets MISC 1 each by Does not apply route as directed. Dispense based on patient and insurance preference. Use up to four times daily as directed. (FOR ICD-10 E10.9, E11.9). 09/18/24   Diona Perkins, MD  valACYclovir  (VALTREX ) 500 MG tablet Take 1 tablet (500 mg total) by mouth 2 (two) times daily. 02/04/24   Cleotilde Perkins, DO    Allergies: Pollen extract    Review of Systems  Updated Vital Signs BP (!) 135/90 (BP Location: Right Arm)   Pulse 88   Temp 97.9 F (36.6 C)   Resp 16   Ht 5' 9 (1.753 m)   Wt 128.8 kg   LMP 08/18/2024   SpO2 98%   BMI 41.94 kg/m   Physical Exam Vitals and nursing note reviewed.  Constitutional:      General: She is not in acute distress.    Appearance: She is well-developed.  HENT:     Head: Normocephalic and atraumatic.     Right Ear: External ear normal.     Left Ear: External ear normal.     Nose: Nose normal.     Mouth/Throat:     Mouth: Mucous membranes are dry.     Comments: Mildly dry mucous membranes. Eyes:     Conjunctiva/sclera: Conjunctivae normal.  Cardiovascular:     Rate and Rhythm: Normal rate and regular rhythm.     Heart sounds: No murmur heard. Pulmonary:     Effort: No respiratory distress.     Breath sounds: No wheezing, rhonchi or rales.  Abdominal:     Palpations: Abdomen is soft.     Tenderness: There is no  abdominal tenderness. There is no guarding or rebound.     Comments: Abdomen is soft and nontender.  Musculoskeletal:     Cervical back: Normal range of motion and neck supple.     Right lower leg: No edema.     Left lower leg: No edema.  Skin:    General: Skin is warm and dry.     Findings: No rash.  Neurological:     General: No focal deficit present.     Mental Status: She is alert. Mental status is at baseline.     Motor: No weakness.  Psychiatric:        Mood and Affect: Mood normal.     (all labs ordered are listed, but only abnormal results are displayed) Labs Reviewed  BASIC METABOLIC PANEL WITH GFR - Abnormal; Notable for the following components:      Result Value   Glucose, Bld 284 (*)    All other components within normal limits  CBC - Abnormal; Notable for the following components:   Platelets 402 (*)    All other components within normal limits  CBG MONITORING, ED - Abnormal; Notable for the following components:   Glucose-Capillary 279 (*)    All other components within normal limits  CBG MONITORING, ED - Abnormal; Notable for the following components:   Glucose-Capillary 295 (*)    All other components within normal limits  CBG MONITORING, ED - Abnormal; Notable for the following components:   Glucose-Capillary 269 (*)    All other components within normal limits    EKG: None  Radiology: No results found.   Procedures   Medications Ordered in the ED  sodium chloride 0.9 % bolus 1,000 mL (has no administration in time range)   ED Course  Patient seen and examined. History obtained directly from patient. Work-up including labs, imaging, EKG ordered in triage, if performed, were reviewed.    Labs/EKG: Independently reviewed and interpreted.  This included: CBC with platelet count 402 otherwise unremarkable; BMP glucose 282 with normal bicarb and creatinine, normal sodium and potassium.  Imaging: None ordered  Medications/Fluids: Ordered: IV fluid  bolus given symptomatic hyperglycemia  Most recent vital signs reviewed and are as follows: BP (!) 135/90 (BP Location: Right Arm)   Pulse 88   Temp 97.9 F (36.6 C)   Resp 16   Ht 5' 9 (1.753 m)   Wt 128.8 kg   LMP 08/18/2024   SpO2 98%   BMI 41.94 kg/m   Initial impression: Hyperglycemia without DKA.  Will treat symptomatically.  Patient has been having some difficulty getting her insulin.  Will have her call  her PCP tomorrow to come up with plan to obtain insulin or alternate medication.  5:04 PM Reassessment performed. Patient appears stable.  She has received small dose of subcu insulin.  Sugar stable in the mid to upper 200s.  Reviewed pertinent lab work and imaging with patient at bedside. Questions answered.   Most current vital signs reviewed and are as follows: BP 121/66   Pulse 71   Temp 98 F (36.7 C)   Resp 18   Ht 5' 9 (1.753 m)   Wt 128.8 kg   LMP 08/18/2024   SpO2 98%   BMI 41.94 kg/m   Plan: Discharge to home.   Prescriptions written for: None  Other home care instructions discussed: Maintain good hydration  ED return instructions discussed: Abdominal pain, vomiting, altered mental status  Follow-up instructions discussed: Patient encouraged to follow-up with their PCP tomorrow.                                   Medical Decision Making Amount and/or Complexity of Data Reviewed Labs: ordered.  Risk Prescription drug management.   Patient with recent diagnosis of diabetes, presents again with hyperglycemia.  Fortunately no evidence of ketosis or other complication today.  Patient was treated with IV fluids.  Labs reassuring.  Also received small amount of subcutaneous insulin.  She has been unable to obtain the insulin prescribed by her primary care 2 days ago.  She will follow-up with them tomorrow regarding plan for this.  Do not feel that she requires hospitalization today.  Patient stable for discharge.  The patient's vital signs,  pertinent lab work and imaging were reviewed and interpreted as discussed in the ED course. Hospitalization was considered for further testing, treatments, or serial exams/observation. However as patient is well-appearing, has a stable exam, and reassuring studies today, I do not feel that they warrant admission at this time. This plan was discussed with the patient who verbalizes agreement and comfort with this plan and seems reliable and able to return to the Emergency Department with worsening or changing symptoms.        Final diagnoses:  Hyperglycemia    ED Discharge Orders     None          Desiderio Chew, PA-C 09/20/24 1707    Geroldine Berg, MD 09/22/24 [phone]

## 2024-09-20 NOTE — ED Triage Notes (Signed)
 Pt reports CBG 340 mg/dL. Pt reports she is prescribed insulin but no one in [place] carries Jard[place]. Pt reports dizziness

## 2024-09-20 NOTE — Discharge Instructions (Signed)
 Your blood sugar was high but there were no complications from this today.  You need to call family practice clinic tomorrow to inquire about other alternatives to your insulin, if you are unable to get this filled.  You are given some insulin today and IV fluids to treat blood sugar.  Please return to the emergency department if you develop abdominal pain, vomiting, or altered mental status.

## 2024-09-20 NOTE — ED Notes (Signed)
 Pt was unaware urine was needed to be collected and had already used the restroom when asked for a sample.

## 2024-09-21 ENCOUNTER — Ambulatory Visit: Payer: Self-pay | Admitting: Student

## 2024-09-21 ENCOUNTER — Other Ambulatory Visit (HOSPITAL_BASED_OUTPATIENT_CLINIC_OR_DEPARTMENT_OTHER): Payer: Self-pay

## 2024-09-21 ENCOUNTER — Ambulatory Visit: Payer: Self-pay

## 2024-09-21 VITALS — BP 109/74 | HR 87 | Ht 69.0 in | Wt 284.2 lb

## 2024-09-21 DIAGNOSIS — E119 Type 2 diabetes mellitus without complications: Secondary | ICD-10-CM

## 2024-09-21 MED ORDER — SEMAGLUTIDE(0.25 OR 0.5MG/DOS) 2 MG/3ML ~~LOC~~ SOPN
0.2500 mg | PEN_INJECTOR | SUBCUTANEOUS | 0 refills | Status: DC
  Filled 2024-09-21: qty 3, 42d supply, fill #0
  Filled 2024-09-22: qty 3, fill #0
  Filled 2024-09-24: qty 3, 42d supply, fill #0

## 2024-09-21 NOTE — Patient Instructions (Signed)
 It was great to see you! Thank you for allowing me to participate in your care!   I recommend that you always bring your medications to each appointment as this makes it easy to ensure we are on the correct medications and helps us  not miss when refills are needed.  Our plans for today:  - Take 10 units of Lantus every night - You will receive a call from our pharmacy team to schedule appointment - I recommend you reduce your carbohydrate intake by half of what you normally eat, and replace with protein or vegetables - Please start taking Ozempic 0.25 mg weekly for 4 weeks - Follow-up in 4 weeks with PCP or Dr. Howell   Take care and seek immediate care sooner if you develop any concerns. Please remember to show up 15 minutes before your scheduled appointment time!  Gladis Howell, DO [facility]

## 2024-09-21 NOTE — Progress Notes (Signed)
    SUBJECTIVE:   CHIEF COMPLAINT / HPI:   Type 2 diabetes Newly diagnosed type 2 diabetes on September 18 2024. Recent ED evaluation on 10/26. Picked up her Lantus today, plans to take 10 units nightly. No current symptoms AM fasting glucose of 170 per patient, highest of 300 middle today's today. Discussed GLP-1 Ozempic, patient has no contraindications.  Reviewed personal/family history of cancers.  No personal history of gallstones.  Counseled extensively on the side effects, and the way the medication works.  Additionally counseled on diet modification while taking Ozempic to prevent significant side effects.  Counseled on exercise. Patient is agreeable to pharmacy referral.  OBJECTIVE:   BP 109/74   Pulse 87   Ht 5' 9 (1.753 m)   Wt 284 lb 4 oz (128.9 kg)   LMP 09/19/2024   SpO2 99%   BMI 41.98 kg/m    General: NAD, pleasant Cardio: RRR, no MRG. Cap Refill <2s. Respiratory: CTAB, normal wob on RA Skin: Warm and dry  ASSESSMENT/PLAN:   Assessment & Plan Type 2 diabetes mellitus without complication, without long-term current use of insulin (HCC) - Lantus 10 units nightly - Ozempic 0.25 mg weekly, titrate to 0.5 mg weekly after 4 weeks - Follow-up in 4 weeks (titrate Ozempic then) - Follow-up with pharmacy clinic - Will work on CGM with pharmacy - UACR today  Gladis Church, DO [facility]

## 2024-09-22 ENCOUNTER — Other Ambulatory Visit: Payer: Self-pay

## 2024-09-22 ENCOUNTER — Other Ambulatory Visit (HOSPITAL_BASED_OUTPATIENT_CLINIC_OR_DEPARTMENT_OTHER): Payer: Self-pay

## 2024-09-22 LAB — MICROALBUMIN / CREATININE URINE RATIO
Creatinine, Urine: 82.6 mg/dL
Microalb/Creat Ratio: 32 mg/g{creat} — ABNORMAL HIGH (ref 0–29)
Microalbumin, Urine: 26.6 ug/mL

## 2024-09-22 MED ORDER — FLUZONE 0.5 ML IM SUSY
0.5000 mL | PREFILLED_SYRINGE | Freq: Once | INTRAMUSCULAR | 0 refills | Status: AC
  Filled 2024-09-22: qty 0.5, 1d supply, fill #0

## 2024-09-23 ENCOUNTER — Ambulatory Visit: Payer: Self-pay | Admitting: Student

## 2024-09-24 ENCOUNTER — Other Ambulatory Visit: Payer: Self-pay

## 2024-09-24 ENCOUNTER — Encounter: Payer: Self-pay | Admitting: Pharmacist

## 2024-09-24 ENCOUNTER — Ambulatory Visit (INDEPENDENT_AMBULATORY_CARE_PROVIDER_SITE_OTHER): Payer: Self-pay | Admitting: Pharmacist

## 2024-09-24 VITALS — BP 108/71 | HR 92 | Wt 283.3 lb

## 2024-09-24 DIAGNOSIS — E119 Type 2 diabetes mellitus without complications: Secondary | ICD-10-CM

## 2024-09-24 DIAGNOSIS — Z794 Long term (current) use of insulin: Secondary | ICD-10-CM

## 2024-09-24 DIAGNOSIS — Z87891 Personal history of nicotine dependence: Secondary | ICD-10-CM

## 2024-09-24 NOTE — Progress Notes (Signed)
 S:     Chief Complaint  Patient presents with   Medication Management    Diabetes - Liberate Enrollment   28 y.o. female who presents for diabetes evaluation, education, and management in the context of the LIBERATE Study. Patient arrives in  good spirits and presents without  any assistance.   Patient was referred and last seen by Primary Care Provider, Dr. Howell, on 09/21/24.  At last visit, referred for diabetes management.   PMH is significant for T2DM. Allergic rhinitis.  Patient reports Diabetes was diagnosed in 2025.   Current diabetes medications include: Lantus (insulin glargine) 10 units  Current hypertension medications include: none Current hyperlipidemia medications include: none  Patient reports adherence to taking all medications as prescribed.   Do you feel that your medications are working for you? yes Have you been experiencing any side effects to the medications prescribed? no Do you have any problems obtaining medications due to transportation or finances? yes Insurance coverage: none - will have starting 1/126 - [place]  Patient denies hypoglycemic events.  O:   Review of Systems  All other systems reviewed and are negative.  Physical Exam Vitals reviewed.  Constitutional:      Appearance: Normal appearance.  Neurological:     Mental Status: She is alert.  Psychiatric:        Mood and Affect: Mood normal.        Behavior: Behavior normal.        Thought Content: Thought content normal.        Judgment: Judgment normal.    Lab Results  Component Value Date   HGBA1C 10.9 (A) 09/18/2024   Vitals:   09/24/24 1701  BP: 108/71  Pulse: 92  SpO2: 100%    Lipid Panel     Component Value Date/Time   CHOL 163 02/04/2024 1632   TRIG 171 (H) 02/04/2024 1632   HDL 39 (L) 02/04/2024 1632   CHOLHDL 4.2 02/04/2024 1632   CHOLHDL 3.2 12/17/2011 0933   VLDL 13 12/17/2011 0933   LDLCALC 94 02/04/2024 1632    A/P:  LIBERATE Study:   -Patient provided verbal consent to participate in the study. Consent documented in electronic medical record.  -Provided education on Libre 3 CGM. Collaborated to ensure Herlene 3 app was downloaded on patient's phone. Educated on how to place sensor every 14 days, patient placed first sensor correctly and verbalized understanding of use, removal, and how to place next sensor. Discussed alarms. 8 sensors provided for a 3 month supply. Educated to contact the office if the sensor falls off early and replacements are needed before their next [employer].   Diabetes recently diagnosed currently uncontrolled with A1C 10.5 on 09/19/24. Patient is  able to verbalize appropriate hypoglycemia management plan. Medication adherence appears good. Control is suboptimal due to suboptimal diabetes regimen. Patient consented and enrolled into LIBERATE study and given sensors.  -Continued basal insulin  Lantus (insulin glargine) st 10 units daily  -Started GLP-1 Ozempic (semaglutide) at 0.25 mg weekly. Sample given -Patient educated on purpose, proper use, and potential adverse effects.  -Extensively discussed pathophysiology of diabetes, recommended lifestyle interventions, dietary effects on blood sugar control.  -Counseled on s/sx of and management of hypoglycemia.  -Next A1c anticipated in 3 months  Medication Samples have been provided to the patient. Drug name: Ozempic   Strength: 2mg /91mL     Qty: 1 box LOT: [id number] Exp.Date: 11/25/2026 Dosing instructions: Inject 0.25 mg into skin weekly  The  patient has been instructed regarding the correct time, dose, and frequency of taking this medication, including desired effects and most common side effects.   Maude Lagos 5:08 PM 09/24/2024  ASCVD risk - primary prevention in patient with diabetes. Last LDL is 94 not at goal of <70  mg/dL. ASCVD risk factors include diabetes. No hyperlipidemia medications initiated yet.   History of tobacco use disorder.   Reports complete abstinence from all tobacco and nicotine products for 8 months.  Congratulated on success and encouraged continued abstinence.  Documented problem list of history - change of status in chart.   Written patient instructions provided. Patient verbalized understanding of treatment plan.  Total time in face to face counseling 26 minutes.    Follow-up:  Pharmacist 10/08/2024 Patient seen with Lawson Mao, PharmD Candidate - PY3 student and Belvie Macintosh, PharmD - PY4 Candidate.

## 2024-09-24 NOTE — Assessment & Plan Note (Addendum)
 LIBERATE Study:  -Patient provided verbal consent to participate in the study. Consent documented in electronic medical record.  -Provided education on Libre 3 CGM. Collaborated to ensure Herlene 3 app was downloaded on patient's phone. Educated on how to place sensor every 14 days, patient placed first sensor correctly and verbalized understanding of use, removal, and how to place next sensor. Discussed alarms. 8 sensors provided for a 3 month supply. Educated to contact the office if the sensor falls off early and replacements are needed before their next [employer].   Diabetes recently diagnosed currently uncontrolled with A1C 10.5 on 09/19/24. Patient is  able to verbalize appropriate hypoglycemia management plan. Medication adherence appears good. Control is suboptimal due to suboptimal diabetes regimen. Patient consented and enrolled into LIBERATE study and given sensors.  -Continued basal insulin  Lantus (insulin glargine) st 10 units daily  -Started GLP-1 Ozempic (semaglutide) at 0.25 mg weekly. Sample given -Patient educated on purpose, proper use, and potential adverse effects.  -Extensively discussed pathophysiology of diabetes, recommended lifestyle interventions, dietary effects on blood sugar control.  -Counseled on s/sx of and management of hypoglycemia.  -Next A1c anticipated in 3 months

## 2024-09-24 NOTE — Patient Instructions (Signed)
 It was nice to see you today!  Your goal blood sugar is 80-130 before eating and less than 180 after eating.  Medication Changes: START Ozempic (semaglutide) 0.25 mg weekly  Continue all other medication the same.   Keep up the good work with diet and exercise. Aim for a diet full of vegetables, fruit and lean meats (chicken, turkey, fish). Try to limit salt intake by eating fresh or frozen vegetables (instead of canned), rinse canned vegetables prior to cooking and do not add any additional salt to meals.    Sensor Application If using the App, you can tap Help in the Main Menu to access an in-app tutorial on applying a Sensor. See below for instructions on how to download the app. Apply Sensors only on the back of your upper arm. If placed in other areas, the Sensor may not function properly and could give you inaccurate readings. Avoid areas with scars, moles, stretch marks, or lumps.   Select an area of skin that generally stays flat during your normal daily activities (no bending or folding). Choose a site that is at least 1 inch (2.5 cm) away from any injection sites. To prevent discomfort or skin irritation, you should select a different site other than the one most recently used. Wash application site using a plain soap, dry, and then clean with an alcohol wipe. This will help remove any oily residue that may prevent the sensor from sticking properly. Allow site to air dry before proceeding. Note: The area MUST be clean and dry, or the Sensor may not stay on for the full wear duration specified by your Sensor insert. 4. Unscrew the cap from the Sensor Applicator and set the cap aside.  5. Place the Sensor Applicator over the prepared site and push down firmly to apply the Sensor to your body. 6. Gently pull the Sensor Applicator away from your body. The Sensor should now be attached to your skin. 7. Make sure the Sensor is secure after application. Put the cap back on the Sensor  Applicator. Discard the used Engineer, Agricultural according to local regulations.  What If My Sensor Falls Off or What If My Sensor Isn't Working? Call [employer] Customer Care Team at [phone] Available 7 days a week from 8AM-8PM EST, excluding holidays If you have multiple sensors fall, contact [facility] at [phone]   The App Download the FreeStyle [employer] 3 App in your phone's app store   Load the app and select get started now Create an account  Tap scan new sensor Follow the prompts on the screen. If your sensor does not sync, try moving your phone slowly around the sensor. Phone cases may affect scanning. This will be the only time you have to scan the sensor until you apply a new sensor.   There will be a 60 minute start up period until the app will display your glucose reading   How To Share Your Readings With Us  Once in the app, go to settings -> connected apps -> LibreView -> Enter Practice ID -> [id number]

## 2024-09-24 NOTE — Assessment & Plan Note (Signed)
 History of tobacco use disorder.  Reports complete abstinence from all tobacco and nicotine products for 8 months.  Congratulated on success and encouraged continued abstinence.  Documented problem list of history - change of status in chart.

## 2024-09-25 NOTE — Progress Notes (Signed)
 Reviewed and agree with Dr Rennis plan.

## 2024-10-05 ENCOUNTER — Telehealth: Payer: Self-pay

## 2024-10-05 NOTE — Telephone Encounter (Signed)
 NEW PAP   In progress of mailing Novo Nordisk application to patients home for Ozempic assistance.

## 2024-10-05 NOTE — Telephone Encounter (Signed)
-----   Message from Burnard KANDICE Lot sent at 09/24/2024  8:54 AM EDT ----- Can you initiate a Novo enrollment for Ozempic if this is one of your MDs?

## 2024-10-07 NOTE — Telephone Encounter (Signed)
Mailed to patient home

## 2024-10-08 ENCOUNTER — Ambulatory Visit: Payer: Self-pay | Admitting: Pharmacist

## 2024-10-15 ENCOUNTER — Encounter: Payer: Self-pay | Admitting: Pharmacist

## 2024-10-15 ENCOUNTER — Ambulatory Visit: Payer: Self-pay | Admitting: Pharmacist

## 2024-10-15 VITALS — BP 117/81 | HR 88 | Wt 285.8 lb

## 2024-10-15 DIAGNOSIS — E119 Type 2 diabetes mellitus without complications: Secondary | ICD-10-CM

## 2024-10-15 DIAGNOSIS — Z794 Long term (current) use of insulin: Secondary | ICD-10-CM

## 2024-10-15 MED ORDER — SEMAGLUTIDE(0.25 OR 0.5MG/DOS) 2 MG/3ML ~~LOC~~ SOPN: 0.5000 mg | PEN_INJECTOR | SUBCUTANEOUS | Status: DC

## 2024-10-15 NOTE — Assessment & Plan Note (Addendum)
 Diabetes longstanding currently getting towards better control with GMI 7.8 from last CGM report. Patient is able to verbalize appropriate hypoglycemia management plan. Medication adherence appears good. Control is suboptimal due to suboptimal regimen. -Continued basal insulin Lantus (insulin glargine) 10 units daily -Increased Ozempic (semaglutide) to 0.5 mg weekly -Patient educated on purpose, proper use, and potential adverse effects.  -Extensively discussed pathophysiology of diabetes, recommended lifestyle interventions, dietary effects on blood sugar control.  -Counseled on s/sx of and management of hypoglycemia.  -Next A1c anticipated at 3 month LIBERATE visit.  -Sample sensors and Ozempic (semaglutide) provided

## 2024-10-15 NOTE — Progress Notes (Signed)
 S:     Chief Complaint  Patient presents with   Medication Management    Diabetes management   28 y.o. female who presents for diabetes evaluation, education, and management. Patient arrives in good spirits and presents without any assistance.   Patient was referred and last seen by Primary Care Provider, Dr. Howell on 09/21/2024.   PMH is significant for Diabetes, hx of tobacco use disorder. Patient reports Diabetes was diagnosed in 2022.   Current diabetes medications include: Lantus  (insulin  glargine) 10 units and Ozempic  (semaglutide ) 0.25 mg   Patient reports adherence to taking all medications as prescribed.   Patient denies hypoglycemic events.  Reported home fasting blood sugars: 150s  Reported 2 hour post-meal/random blood sugars: 100-130s.  Patient denies nocturia (nighttime urination).  Patient denies neuropathy (nerve pain). Patient reports visual changes. Patient denies self foot exams.   Patient reported dietary habits: Eats 2-3 meals/day Breakfast: avocado tomato toast Dinner: quesidilla  Snacks: kind bars, pudding, spicy pickles Drinks: sparkling ice, propels, vitamin waters  Patient-reported exercise habits: 30 minutes on treadmill every other day  O:  Review of Systems  All other systems reviewed and are negative.  Physical Exam Vitals reviewed.  Constitutional:      Appearance: Normal appearance.  Pulmonary:     Effort: Pulmonary effort is normal.  Neurological:     Mental Status: She is alert.  Psychiatric:        Mood and Affect: Mood normal.        Behavior: Behavior normal.        Thought Content: Thought content normal.        Judgment: Judgment normal.   Libre3 CGM Download today: GMI 7.8 Average glucose: 188  TIR: 42.5 High: 55% Lows:0%  Lab Results  Component Value Date   HGBA1C 10.9 (A) 09/18/2024   Vitals:   10/15/24 1606  BP: 117/81  Pulse: 88  SpO2: 99%    Lipid Panel     Component Value Date/Time   CHOL  163 02/04/2024 1632   TRIG 171 (H) 02/04/2024 1632   HDL 39 (L) 02/04/2024 1632   CHOLHDL 4.2 02/04/2024 1632   CHOLHDL 3.2 12/17/2011 0933   VLDL 13 12/17/2011 0933   LDLCALC 94 02/04/2024 1632   A/P: Diabetes longstanding currently getting towards better control with GMI 7.8 from last CGM report. Patient is able to verbalize appropriate hypoglycemia management plan. Medication adherence appears good. Control is suboptimal due to suboptimal regimen. -Continued basal insulin  Lantus  (insulin  glargine) 10 units daily -Increased Ozempic  (semaglutide ) to 0.5 mg weekly -Patient educated on purpose, proper use, and potential adverse effects.  -Extensively discussed pathophysiology of diabetes, recommended lifestyle interventions, dietary effects on blood sugar control.  -Counseled on s/sx of and management of hypoglycemia.  -Next A1c anticipated at 3 month LIBERATE visit.  -Sample sensors and Ozempic  (semaglutide ) provided  Medication Samples have been provided to the patient.  Drug name: Ozempic  (semaglutide )        Strength: 0.5 mg        Qty: 1 box  LOT: [id number] Exp.Date: 11/25/2026  Dosing instructions: Inject 0.5 mg weekly  The patient has been instructed regarding the correct time, dose, and frequency of taking this medication, including desired effects and most common side effects.   Maude Lagos 4:38 PM 10/15/2024   Written patient instructions provided. Patient verbalized understanding of treatment plan.  Total time in face to face counseling 30 minutes.    Follow-up:  Pharmacist 12/10/2024 Patient seen  with Lawson Mao, PharmD Candidate - PY3 student and Recardo [employer] PharmD - PY4 Candidate.

## 2024-10-15 NOTE — Patient Instructions (Addendum)
 It was nice to see you today!  Your goal blood sugar is 80-130 before eating and less than 180 after eating.  Medication Changes: Increase Ozempic (semaglutide) to 0.5 mg weekly  Continue all other medication the same.   Keep up the good work with diet and exercise. Aim for a diet full of vegetables, fruit and lean meats (chicken, turkey, fish). Try to limit salt intake by eating fresh or frozen vegetables (instead of canned), rinse canned vegetables prior to cooking and do not add any additional salt to meals.

## 2024-10-16 NOTE — Progress Notes (Signed)
 Reviewed and agree with Dr Rennis plan.

## 2024-10-28 ENCOUNTER — Ambulatory Visit: Admitting: Dermatology

## 2024-11-16 ENCOUNTER — Telehealth: Payer: Self-pay | Admitting: Pharmacist

## 2024-11-16 ENCOUNTER — Other Ambulatory Visit (HOSPITAL_BASED_OUTPATIENT_CLINIC_OR_DEPARTMENT_OTHER): Payer: Self-pay

## 2024-11-16 MED ORDER — OZEMPIC (1 MG/DOSE) 4 MG/3ML ~~LOC~~ SOPN
1.0000 mg | PEN_INJECTOR | SUBCUTANEOUS | 5 refills | Status: DC
  Filled 2024-11-16 – 2024-11-27 (×2): qty 3, 28d supply, fill #0

## 2024-11-16 NOTE — Telephone Encounter (Signed)
 Patient contacted for follow-up of LIBERATE study sensor recall.   Since last contact patient reports no adverse effect from the sensors.  She currently has no sensors remaining and requests resupply to last until planned visit 12/10/2024  Two additional sensors placed at front desk for pick-up and use.   Dose increase of Ozempic  (semaglutide ) discussed and we agreed to increase dose after the holidays to minimize risk of GI side effects during holiday times.  She has noticed minimal weight loss at this time. Glucose readings are doing very well.  GMI 6.5  Avg 133  TIR 97%  New prescription for 1mg  pen/dose sent to pharmacy for use.   Total time with patient call and documentation of interaction: .

## 2024-11-16 NOTE — Telephone Encounter (Signed)
 Reviewed and agree with Dr Rennis plan.

## 2024-11-17 ENCOUNTER — Other Ambulatory Visit (HOSPITAL_BASED_OUTPATIENT_CLINIC_OR_DEPARTMENT_OTHER): Payer: Self-pay

## 2024-11-27 ENCOUNTER — Other Ambulatory Visit (HOSPITAL_BASED_OUTPATIENT_CLINIC_OR_DEPARTMENT_OTHER): Payer: Self-pay

## 2024-12-01 ENCOUNTER — Ambulatory Visit: Payer: Self-pay | Admitting: Pharmacist

## 2024-12-10 ENCOUNTER — Ambulatory Visit: Payer: Self-pay | Admitting: Pharmacist

## 2024-12-10 ENCOUNTER — Other Ambulatory Visit (HOSPITAL_BASED_OUTPATIENT_CLINIC_OR_DEPARTMENT_OTHER): Payer: Self-pay

## 2024-12-10 VITALS — BP 120/70 | HR 78 | Wt 287.0 lb

## 2024-12-10 DIAGNOSIS — E119 Type 2 diabetes mellitus without complications: Secondary | ICD-10-CM

## 2024-12-10 DIAGNOSIS — Z794 Long term (current) use of insulin: Secondary | ICD-10-CM

## 2024-12-10 LAB — POCT GLYCOSYLATED HEMOGLOBIN (HGB A1C): HbA1c, POC (controlled diabetic range): 6.9 % (ref 0.0–7.0)

## 2024-12-10 MED ORDER — MOUNJARO 2.5 MG/0.5ML ~~LOC~~ SOAJ: 2.5000 mg | SUBCUTANEOUS | Status: DC

## 2024-12-10 MED ORDER — MOUNJARO 5 MG/0.5ML ~~LOC~~ SOAJ
5.0000 mg | SUBCUTANEOUS | 2 refills | Status: AC
  Filled 2024-12-10: qty 2, 28d supply, fill #0

## 2024-12-10 NOTE — Assessment & Plan Note (Signed)
 Diabetes longstanding currently controlled, A1c today is 6.9%. Patient is able to verbalize appropriate hypoglycemia management plan. Medication adherence appears good.  - Decreased dose of basal insulin  Lantus  (insulin  glargine) to 8 units daily. Counseled to decrease to 6 units if sugars are < 70 mg/dL.  - Started GLP-1 Mounjaro  (tirzepatide ) 2.5 mg. Patient educated on purpose, proper use and potential adverse effects of nausea.  Following instruction patient verbalized understanding of treatment plan. Patient was able to demonstrate appropriate technique while administering first dose in office  -  Patient resumed on Lucas sensors. Given supply for 3 months.  - Extensively discussed pathophysiology of diabetes, recommended lifestyle interventions, dietary effects on glucose control.  - Counseled on s/sx of and management of hypoglycemia

## 2024-12-10 NOTE — Progress Notes (Signed)
 "   S:    Chief Complaint  Patient presents with   Medication Management    Diabetes follow-up    29 y.o. female who presents for diabetes evaluation, education, and management. Patient arrives in good spirits and presents without any assistance.    Patient was referred and last seen by Primary Care Provider, Dr. Howell, on 09/21/2024. At last visit with me, Ozempic  (semaglutide ) dose was increased.   PMH is significant for Diabetes, hx of tobacco use disorder. Patient reports Diabetes was diagnosed in 2022.   Current diabetes medications include: Lantus  (insulin  glargine) 10 units daily Current hypertension medications include: none Current hyperlipidemia medications include: none  Patient reports adherence to taking all medications as prescribed.   Do you feel that your medications are working for you? No, reports she feels Ozempic  (semaglutide ) is not working for her. Never picked up 1 mg dose. Reports last dose 2 weeks ago.   Insurance coverage: [employer] Employee   Patient denies hypoglycemic events. Reports sugars have been < 200 mg/dL, has seen numbers in the 60s.   O:   Review of Systems  All other systems reviewed and are negative.  Physical Exam Vitals reviewed.  Constitutional:      Appearance: Normal appearance.  Pulmonary:     Effort: Pulmonary effort is normal.  Neurological:     Mental Status: She is alert.  Psychiatric:        Mood and Affect: Mood normal.        Behavior: Behavior normal.        Thought Content: Thought content normal.        Judgment: Judgment normal.    Libre3 CGM Download today from 11/01/24-11/24/24 % Time CGM is active: 86% Average Glucose: 133 mg/dL Glucose Management Indicator: 6.5%  Glucose Variability: 16.3% (goal <36%) Time in Goal:  - Time in range 70-180: 97% - Time above range: 3% - Time below range: 0% Observed patterns:  Lab Results  Component Value Date   HGBA1C 6.9 12/10/2024   Vitals:   12/10/24 1611  BP:  120/70  Pulse: 78  SpO2: 98%    Lipid Panel     Component Value Date/Time   CHOL 163 02/04/2024 1632   TRIG 171 (H) 02/04/2024 1632   HDL 39 (L) 02/04/2024 1632   CHOLHDL 4.2 02/04/2024 1632   CHOLHDL 3.2 12/17/2011 0933   VLDL 13 12/17/2011 0933   LDLCALC 94 02/04/2024 1632    Clinical Atherosclerotic Cardiovascular Disease (ASCVD): No  The ASCVD Risk score (Arnett DK, et al., 2019) failed to calculate for the following reasons:   The 2019 ASCVD risk score is only valid for ages 65 to 61   * - Cholesterol units were assumed  Lab Results  Component Value Date   CHOL 163 02/04/2024   HDL 39 (L) 02/04/2024   LDLCALC 94 02/04/2024   TRIG 171 (H) 02/04/2024   CHOLHDL 4.2 02/04/2024    Lab Results  Component Value Date   CREATININE 0.65 09/20/2024   BUN 6 09/20/2024   NA 135 09/20/2024   K 3.9 09/20/2024   CL 100 09/20/2024   CO2 23 09/20/2024    Medications Reviewed Today     Reviewed by Josten Warmuth G, RPH-CPP (Pharmacist) on 12/10/24 at 1639  Med List Status: <None>   Medication Order Taking? Sig Documenting Provider Last Dose Status Informant  Blood Glucose Monitoring Suppl DEVI [id number]  1 each by Does not apply route as directed. Dispense based on patient  and insurance preference. Use up to four times daily as directed. (FOR ICD-10 E10.9, E11.9). Diona Perkins, MD  Active   cetirizine  (ZYRTEC  ALLERGY) 10 MG tablet [id number]  Take 1 tablet (10 mg total) by mouth daily.  Patient not taking: Reported on 12/10/2024   Cleotilde Perkins, DO  Active   Fluocinolone  Acetonide Scalp (DERMA-SMOOTHE /FS SCALP) 0.01 % OIL [id number] Yes Apply to scalp 2-3 hours prior to washing hair. Then rinse with ketoconazole  shampoo. Alm Delon SAILOR, DO  Active   Glucose Blood (BLOOD GLUCOSE TEST STRIPS) STRP [id number]  1 each by Does not apply route as directed. Dispense based on patient and insurance preference. Use up to four times daily as directed. (FOR ICD-10 E10.9, E11.9). Diona Perkins, MD   Active   insulin  glargine (LANTUS ) 100 UNIT/ML Solostar Pen [id number] Yes Inject 10 Units into the skin at bedtime. Diona Perkins, MD  Active   Insulin  Pen Needle (PEN NEEDLES) 32G X 4 MM MISC [id number] Yes 1 each by Does not apply route daily. Donzetta Rollene BRAVO, MD  Active   ketoconazole  (NIZORAL ) 2 % shampoo [id number] Yes Apply to the affected area(s) topically 2 (two) times a week. Alm Delon SAILOR, DO  Active   Lancet Device MISC [id number]  1 each by Does not apply route as directed. Dispense based on patient and insurance preference. Use up to four times daily as directed. (FOR ICD-10 E10.9, E11.9). Diona Perkins, MD  Active   Lancets MISC [id number]  1 each by Does not apply route as directed. Dispense based on patient and insurance preference. Use up to four times daily as directed. (FOR ICD-10 E10.9, E11.9). Diona Perkins, MD  Active    Patient not taking:   Discontinued 12/10/24 1638 (Change in therapy)    Discontinued 12/10/24 1637   tirzepatide  (MOUNJARO ) 5 MG/0.5ML Pen [id number] Yes Inject 5 mg into the skin once a week. McDiarmid, Krystal BIRCH, MD  Active   valACYclovir  (VALTREX ) 500 MG tablet [id number] Yes Take 1 tablet (500 mg total) by mouth 2 (two) times daily. Cleotilde Perkins, DO  Active            A/P: Diabetes longstanding currently controlled, A1c today is 6.9%. Patient is able to verbalize appropriate hypoglycemia management plan. Medication adherence appears good.  - Decreased dose of basal insulin  Lantus  (insulin  glargine) to 8 units daily. Counseled to decrease to 6 units if sugars are < 70 mg/dL.  - Started GLP-1 Mounjaro  (tirzepatide ) 2.5 mg. Patient educated on purpose, proper use and potential adverse effects of nausea.  Following instruction patient verbalized understanding of treatment plan. Patient was able to demonstrate appropriate technique while administering first dose in office  -  Patient resumed on Booth sensors. Given supply for 3 months.  - Extensively discussed  pathophysiology of diabetes, recommended lifestyle interventions, dietary effects on glucose control.  - Counseled on s/sx of and management of hypoglycemia.   Written patient instructions provided. Patient verbalized understanding of treatment plan.  Total time in face to face counseling 36 minutes.    Follow-up:  Pharmacist visit TBD PCP clinic visit 01/22/25 Patient seen with Duwaine Dry, PharmD Candidate - PY4 student   "

## 2024-12-10 NOTE — Patient Instructions (Addendum)
 It was nice to see you today! Keep up the great work!   Your goal glucose value is 80-130 before eating and less than 180 after eating.  Medication Changes: START Mounjaro  (tirzepatide ) 2.5 mg weekly.  STOP Ozempic  (semaglutide ).   Decrease Lantus  (insulin  glargine) to 8 units daily. You can decrease the dose to 6 units daily if your sugars are < 70.   Continue all other medication the same.   Keep up the good work with diet and exercise. Aim for a diet full of vegetables, fruit and lean meats (chicken, turkey, fish). Try to limit salt intake by eating fresh or frozen vegetables (instead of canned), rinse canned vegetables prior to cooking and do not add any additional salt to meals.    Please bring all medications to your clinic visits.  Please arrive 10-15 minutes prior to your scheduled visit time.

## 2024-12-11 ENCOUNTER — Other Ambulatory Visit (HOSPITAL_BASED_OUTPATIENT_CLINIC_OR_DEPARTMENT_OTHER): Payer: Self-pay

## 2024-12-11 NOTE — Progress Notes (Signed)
 Reviewed and agree with Dr Rennis plan.

## 2024-12-23 ENCOUNTER — Other Ambulatory Visit (HOSPITAL_BASED_OUTPATIENT_CLINIC_OR_DEPARTMENT_OTHER): Payer: Self-pay

## 2025-01-22 ENCOUNTER — Ambulatory Visit: Payer: Self-pay | Admitting: Family Medicine

## 2025-04-05 ENCOUNTER — Ambulatory Visit: Payer: Self-pay | Admitting: Dermatology
